# Patient Record
Sex: Male | Born: 1960 | Hispanic: Yes | Marital: Married | Smoking: Former smoker
Health system: Southern US, Community
[De-identification: ages and names within clinical notes are randomized; demographics above are authoritative.]

## PROBLEM LIST (undated history)

## (undated) DIAGNOSIS — I10 Essential (primary) hypertension: Secondary | ICD-10-CM

## (undated) DIAGNOSIS — E785 Hyperlipidemia, unspecified: Secondary | ICD-10-CM

## (undated) HISTORY — DX: Essential (primary) hypertension: I10

---

## 1983-10-05 HISTORY — PX: FRACTURE SURGERY: SHX138

## 2011-11-11 ENCOUNTER — Ambulatory Visit: Payer: Self-pay | Admitting: Pain Medicine

## 2011-11-25 ENCOUNTER — Ambulatory Visit: Payer: Self-pay | Admitting: Pain Medicine

## 2011-12-06 ENCOUNTER — Ambulatory Visit: Payer: Self-pay | Admitting: Pain Medicine

## 2011-12-16 ENCOUNTER — Ambulatory Visit: Payer: Self-pay | Admitting: Pain Medicine

## 2012-01-12 ENCOUNTER — Ambulatory Visit: Payer: Self-pay | Admitting: Pain Medicine

## 2012-01-20 ENCOUNTER — Ambulatory Visit: Payer: Self-pay | Admitting: Pain Medicine

## 2012-02-02 ENCOUNTER — Ambulatory Visit: Payer: Self-pay | Admitting: Pain Medicine

## 2015-07-15 ENCOUNTER — Ambulatory Visit: Payer: Self-pay

## 2015-09-04 ENCOUNTER — Ambulatory Visit: Payer: Self-pay

## 2015-09-04 LAB — BASIC METABOLIC PANEL
BUN: 13 mg/dL (ref 4–21)
Creatinine: 0.7 mg/dL (ref <=1.3)
Glucose: 105 mg/dL
POTASSIUM: 5 mmol/L (ref 3.4–5.3)
Sodium: 141 mmol/L (ref 137–147)

## 2015-09-04 LAB — HEPATIC FUNCTION PANEL
ALT: 32 U/L (ref 10–40)
AST: 22 U/L (ref 14–40)
Alkaline Phosphatase: 64 U/L (ref 25–125)
Bilirubin, Total: 0.3 mg/dL

## 2015-09-04 LAB — LIPID PANEL
CHOLESTEROL: 191 mg/dL (ref 0–200)
HDL: 30 mg/dL — AB (ref 35–70)
LDL CALC: 129 mg/dL
Triglycerides: 160 mg/dL (ref 40–160)

## 2015-09-04 LAB — HEMOGLOBIN A1C: Hemoglobin A1C: 5.7

## 2015-09-04 LAB — PSA: PSA: 1.3

## 2015-09-04 LAB — TSH: TSH: 3.27 u[IU]/mL (ref <=5.90)

## 2015-09-16 ENCOUNTER — Other Ambulatory Visit: Payer: Self-pay

## 2015-09-16 LAB — CBC AND DIFFERENTIAL
HEMATOCRIT: 40 % — AB (ref 41–53)
HEMOGLOBIN: 13.9 g/dL (ref 13.5–17.5)
NEUTROS ABS: 3 /uL
PLATELETS: 197 10*3/uL (ref 150–399)
WBC: 5.6 10*3/mL

## 2015-09-18 ENCOUNTER — Ambulatory Visit: Payer: Self-pay

## 2015-09-18 ENCOUNTER — Ambulatory Visit: Payer: Self-pay | Admitting: Ophthalmology

## 2015-09-25 ENCOUNTER — Ambulatory Visit: Payer: Self-pay | Admitting: Ophthalmology

## 2015-10-07 ENCOUNTER — Ambulatory Visit: Payer: Self-pay

## 2015-10-30 ENCOUNTER — Ambulatory Visit: Payer: Self-pay

## 2015-10-30 DIAGNOSIS — R7303 Prediabetes: Secondary | ICD-10-CM | POA: Insufficient documentation

## 2015-10-30 DIAGNOSIS — I10 Essential (primary) hypertension: Secondary | ICD-10-CM | POA: Insufficient documentation

## 2015-10-30 DIAGNOSIS — N400 Enlarged prostate without lower urinary tract symptoms: Secondary | ICD-10-CM | POA: Insufficient documentation

## 2015-12-23 DIAGNOSIS — R7303 Prediabetes: Secondary | ICD-10-CM

## 2015-12-23 DIAGNOSIS — N400 Enlarged prostate without lower urinary tract symptoms: Secondary | ICD-10-CM

## 2015-12-23 DIAGNOSIS — I10 Essential (primary) hypertension: Secondary | ICD-10-CM

## 2016-01-05 ENCOUNTER — Ambulatory Visit: Payer: Self-pay

## 2016-01-08 ENCOUNTER — Other Ambulatory Visit: Payer: Self-pay

## 2016-01-08 DIAGNOSIS — E785 Hyperlipidemia, unspecified: Secondary | ICD-10-CM

## 2016-01-08 DIAGNOSIS — I10 Essential (primary) hypertension: Secondary | ICD-10-CM

## 2016-01-09 LAB — COMPREHENSIVE METABOLIC PANEL
ALBUMIN: 4.5 g/dL (ref 3.5–5.5)
ALK PHOS: 59 IU/L (ref 39–117)
ALT: 27 IU/L (ref 0–44)
AST: 22 IU/L (ref 0–40)
Albumin/Globulin Ratio: 1.9 (ref 1.2–2.2)
BILIRUBIN TOTAL: 0.5 mg/dL (ref 0.0–1.2)
BUN / CREAT RATIO: 11 (ref 9–20)
BUN: 9 mg/dL (ref 6–24)
CO2: 26 mmol/L (ref 18–29)
CREATININE: 0.81 mg/dL (ref 0.76–1.27)
Calcium: 9.2 mg/dL (ref 8.7–10.2)
Chloride: 99 mmol/L (ref 96–106)
GFR, EST AFRICAN AMERICAN: 117 mL/min/{1.73_m2} (ref >59)
GFR, EST NON AFRICAN AMERICAN: 101 mL/min/{1.73_m2} (ref >59)
GLUCOSE: 96 mg/dL (ref 65–99)
Globulin, Total: 2.4 g/dL (ref 1.5–4.5)
Potassium: 5 mmol/L (ref 3.5–5.2)
SODIUM: 139 mmol/L (ref 134–144)
Total Protein: 6.9 g/dL (ref 6.0–8.5)

## 2016-01-09 LAB — LIPID PANEL
CHOL/HDL RATIO: 4.8 ratio (ref 0.0–5.0)
Cholesterol, Total: 179 mg/dL (ref 100–199)
HDL: 37 mg/dL — ABNORMAL LOW (ref >39)
LDL Calculated: 124 mg/dL — ABNORMAL HIGH (ref 0–99)
Triglycerides: 91 mg/dL (ref 0–149)
VLDL CHOLESTEROL CAL: 18 mg/dL (ref 5–40)

## 2016-01-15 ENCOUNTER — Ambulatory Visit: Payer: Self-pay

## 2016-01-15 NOTE — Progress Notes (Unsigned)
Subjective:     Patient ID: Scott Bishop, male   DOB: 07/05/1961, 55 y.o.   MRN: [id number]  HPI Here to review labs. Had blood work for cholesterol/lipids.    Review of Systems     Objective:   Physical Exam     Assessment:     HTN Controlled. Continue with the lisinoprils  BPH States since starting that he has not been able to ejaculate and has had decreased libido. Is able to maintain an erection.   Urinary frequency was approximately 3-times/night. Had difficulty filling the Rapaflo medication at [facility]. Would like an alternative drug if it is easy to get. Would prefer to try to get the medication through [facility]. Consider 5-alpha-redutase   Prediabetes.  Recently lost weight. Continue with Lorrie's class    Physical exam  Cardo: nml s1/s2. No m/r/g. Carotid clear to ausc.  Pulm: CTAB     Plan:     HTN Continue with lisinopril as prescribed.   BPH Consider switching to the 5-alpha-reductase. Would like to receive from [facility].  Prediabetes Continue with Lorrie's class. Pull A1c in a few months to see if prediabetic.   Lipid Panel  Elevated LDL (124). Down from 4 months ago. Recent weight loss -- 22 lbs over month (purposeful). Patient is in Lorrie's class and would like to continue weight loss. At this time I would advise conservative measures for reduction in LDL. I would advise pulling another panel in 3 months to see if improved.

## 2016-01-22 ENCOUNTER — Other Ambulatory Visit: Payer: Self-pay

## 2016-01-28 ENCOUNTER — Ambulatory Visit: Payer: Self-pay

## 2016-01-29 ENCOUNTER — Ambulatory Visit: Payer: Self-pay

## 2016-04-13 ENCOUNTER — Ambulatory Visit: Payer: Self-pay | Admitting: Nurse Practitioner

## 2016-04-13 VITALS — BP 114/66 | HR 61 | Temp 98.5°F | Wt 238.0 lb

## 2016-04-13 DIAGNOSIS — A6000 Herpesviral infection of urogenital system, unspecified: Secondary | ICD-10-CM

## 2016-04-13 DIAGNOSIS — I10 Essential (primary) hypertension: Secondary | ICD-10-CM

## 2016-04-13 MED ORDER — ACYCLOVIR 400 MG PO TABS: 400.0000 mg | ORAL_TABLET | Freq: Two times a day (BID) | ORAL | Status: DC

## 2016-04-13 MED ORDER — TAMSULOSIN HCL 0.4 MG PO CAPS: 0.4000 mg | ORAL_CAPSULE | Freq: Every day | ORAL | Status: DC

## 2016-04-13 MED ORDER — LISINOPRIL 20 MG PO TABS: 20.0000 mg | ORAL_TABLET | Freq: Every day | ORAL | Status: DC

## 2016-04-13 NOTE — Progress Notes (Signed)
Has completed treatment for shingles,   He is concerned that there is a connection between zoster and genital herpes.  Wife recently diagnosed with genital herpes,   Pt acknowledges taht they were apart for 2.5 years and he had unprotected sex.  Requesting testing for herpes simplex    Pt wishes to stay on suppression therapy to prevent inadverent transmission to his wife.    His wife took med for her acute outbreak.    On exam herpes zoster has cleared  Plan:    Will continue his antihypertensive meds,   Will continue his flomax for his prostatic hypertrophy  Will have him take acyclovir 400 mg bid to protect his wife as he was like the source of her infection.    Discussed the pathophysiology of and different types of herpes viruses.    Will drtaw HSV 1&2 antibiodies tonite and report when availab;e.  Pt to continue his excellent weight loss regimen understannding verbalized.

## 2016-04-15 LAB — COMPREHENSIVE METABOLIC PANEL
ALK PHOS: 58 IU/L (ref 39–117)
ALT: 17 IU/L (ref 0–44)
AST: 19 IU/L (ref 0–40)
Albumin/Globulin Ratio: 2.3 — ABNORMAL HIGH (ref 1.2–2.2)
Albumin: 4.5 g/dL (ref 3.5–5.5)
BUN/Creatinine Ratio: 24 — ABNORMAL HIGH (ref 9–20)
BUN: 16 mg/dL (ref 6–24)
Bilirubin Total: 0.3 mg/dL (ref 0.0–1.2)
CALCIUM: 9.1 mg/dL (ref 8.7–10.2)
CO2: 24 mmol/L (ref 18–29)
CREATININE: 0.66 mg/dL — AB (ref 0.76–1.27)
Chloride: 104 mmol/L (ref 96–106)
GFR calc Af Amer: 127 mL/min/{1.73_m2} (ref >59)
GFR, EST NON AFRICAN AMERICAN: 110 mL/min/{1.73_m2} (ref >59)
Globulin, Total: 2 g/dL (ref 1.5–4.5)
Glucose: 109 mg/dL — ABNORMAL HIGH (ref 65–99)
POTASSIUM: 4 mmol/L (ref 3.5–5.2)
SODIUM: 145 mmol/L — AB (ref 134–144)
Total Protein: 6.5 g/dL (ref 6.0–8.5)

## 2016-04-15 LAB — HSV 1 AND 2 IGM ABS, INDIRECT
HSV 1 IgM: <1:10 {titer}
HSV 2 IgM: <1:10 {titer}

## 2016-05-05 ENCOUNTER — Telehealth: Payer: Self-pay

## 2016-05-05 MED ORDER — ACYCLOVIR 400 MG PO TABS: 400.0000 mg | ORAL_TABLET | Freq: Every day | ORAL | 12 refills | Status: DC

## 2016-05-05 MED ORDER — ACYCLOVIR 400 MG PO TABS: 400.0000 mg | ORAL_TABLET | Freq: Two times a day (BID) | ORAL | 11 refills | Status: DC

## 2016-05-10 NOTE — Telephone Encounter (Signed)
Refill

## 2016-05-13 ENCOUNTER — Other Ambulatory Visit: Payer: Self-pay

## 2016-05-13 DIAGNOSIS — B029 Zoster without complications: Secondary | ICD-10-CM

## 2016-05-13 MED ORDER — ACYCLOVIR 400 MG PO TABS: 400.0000 mg | ORAL_TABLET | Freq: Two times a day (BID) | ORAL | 11 refills | Status: DC

## 2016-07-13 ENCOUNTER — Ambulatory Visit: Payer: Self-pay | Admitting: Adult Health Nurse Practitioner

## 2016-07-13 VITALS — BP 105/51 | HR 60 | Temp 98.3°F | Ht 71.0 in | Wt 239.0 lb

## 2016-07-13 DIAGNOSIS — N400 Enlarged prostate without lower urinary tract symptoms: Secondary | ICD-10-CM

## 2016-07-13 DIAGNOSIS — R7303 Prediabetes: Secondary | ICD-10-CM

## 2016-07-13 DIAGNOSIS — I10 Essential (primary) hypertension: Secondary | ICD-10-CM

## 2016-07-13 LAB — GLUCOSE, POCT (MANUAL RESULT ENTRY): POC Glucose: 92 mg/dl (ref 70–99)

## 2016-07-13 MED ORDER — TAMSULOSIN HCL 0.4 MG PO CAPS: 0.4000 mg | ORAL_CAPSULE | Freq: Every day | ORAL | 3 refills | Status: DC

## 2016-07-13 MED ORDER — LISINOPRIL 10 MG PO TABS: 10.0000 mg | ORAL_TABLET | Freq: Every day | ORAL | 3 refills | Status: DC

## 2016-07-13 NOTE — Patient Instructions (Signed)
Labs in January and FU in 1 month for BP check.

## 2016-07-13 NOTE — Progress Notes (Signed)
  Patient: Scott Bishop    DOB: 01/22/1961   55 y.o.   MRN: [id number] Visit Date: 07/13/2016  Today's Provider: [facility]   Chief Complaint  Patient presents with  . Follow-up   Subjective:    HPI   HTN:  Tolerating medications well.  Not taking BP at home.  Denies low sodium diet.  Reports he is exercising at least 5 times week.      No Known Allergies Previous Medications   ACYCLOVIR (ZOVIRAX) 400 MG TABLET    Take 1 tablet (400 mg total) by mouth 2 (two) times daily.   LISINOPRIL (PRINIVIL,ZESTRIL) 20 MG TABLET    Take 1 tablet (20 mg total) by mouth daily.   TAMSULOSIN (FLOMAX) 0.4 MG CAPS CAPSULE    Take 1 capsule (0.4 mg total) by mouth daily.    Review of Systems  All other systems reviewed and are negative.   Social History  Substance Use Topics  . Smoking status: Former Smoker    Quit date: 10/04/1997  . Smokeless tobacco: Not on file  . Alcohol use No     Comment: Quit in 1999.    Objective:   BP (!) 105/51 (BP Location: Left Arm, Patient Position: Sitting)   Pulse 60   Temp 98.3 F (36.8 C)   Ht 5\' 11"  (1.803 m)   Wt 239 lb (108.4 kg)   BMI 33.33 kg/m   Physical Exam  Constitutional: He is oriented to person, place, and time. He appears well-developed and well-nourished.  HENT:  Head: Normocephalic and atraumatic.  Neck: Normal range of motion. Neck supple.  Cardiovascular: Normal rate, regular rhythm and normal heart sounds.   Pulmonary/Chest: Effort normal and breath sounds normal.  Abdominal: Soft. Bowel sounds are normal.  Musculoskeletal: Normal range of motion.  Neurological: He is alert and oriented to person, place, and time.  Skin: Skin is warm and dry.  Psychiatric: He has a normal mood and affect.        Assessment & Plan:         HTN:  Well controlled.  At goal.  Continue exercise, low salt diet encouraged.  Decreasing Lisinopril to 10mg .  FU in 1 month for BP check.   CBG today 92-   HSV:  Titer  negative.  D/C  Acyclovir.   BPH:  Will continue tamulosin.  Monitor for symptoms.   [facility]   [facility]

## 2016-07-15 ENCOUNTER — Other Ambulatory Visit: Payer: Self-pay

## 2016-07-15 MED ORDER — LISINOPRIL 10 MG PO TABS: 10.0000 mg | ORAL_TABLET | Freq: Every day | ORAL | 3 refills | Status: DC

## 2016-07-15 MED ORDER — TAMSULOSIN HCL 0.4 MG PO CAPS: 0.4000 mg | ORAL_CAPSULE | Freq: Every day | ORAL | 3 refills | Status: DC

## 2016-07-15 NOTE — Telephone Encounter (Signed)
Patient stopped by to say that [facility] did not receive prescriptions for lisinopril and Flomax written for on 07/13/16.  Wanted to see if we could reorder them to resend to pharmacy.  He was instructed to check with pharmacy Friday morning (07/16/16) to see if they were received.

## 2016-08-10 ENCOUNTER — Other Ambulatory Visit: Payer: Self-pay

## 2016-08-10 ENCOUNTER — Ambulatory Visit: Payer: Self-pay

## 2016-08-10 VITALS — BP 104/61

## 2016-08-10 DIAGNOSIS — I10 Essential (primary) hypertension: Secondary | ICD-10-CM

## 2016-08-10 MED ORDER — LISINOPRIL 10 MG PO TABS: 10.0000 mg | ORAL_TABLET | Freq: Every day | ORAL | 3 refills | Status: DC

## 2016-09-07 ENCOUNTER — Other Ambulatory Visit: Payer: Self-pay | Admitting: Adult Health Nurse Practitioner

## 2016-09-07 ENCOUNTER — Other Ambulatory Visit: Payer: Self-pay

## 2016-10-12 ENCOUNTER — Other Ambulatory Visit: Payer: Self-pay

## 2016-10-12 DIAGNOSIS — I10 Essential (primary) hypertension: Secondary | ICD-10-CM

## 2016-10-13 LAB — COMPREHENSIVE METABOLIC PANEL
ALT: 20 IU/L (ref 0–44)
AST: 17 IU/L (ref 0–40)
Albumin/Globulin Ratio: 2.2 (ref 1.2–2.2)
Albumin: 4.4 g/dL (ref 3.5–5.5)
Alkaline Phosphatase: 55 IU/L (ref 39–117)
BILIRUBIN TOTAL: 0.2 mg/dL (ref 0.0–1.2)
BUN/Creatinine Ratio: 23 — ABNORMAL HIGH (ref 9–20)
BUN: 16 mg/dL (ref 6–24)
CALCIUM: 9 mg/dL (ref 8.7–10.2)
CHLORIDE: 103 mmol/L (ref 96–106)
CO2: 28 mmol/L (ref 18–29)
Creatinine, Ser: 0.71 mg/dL — ABNORMAL LOW (ref 0.76–1.27)
GFR calc non Af Amer: 106 mL/min/{1.73_m2} (ref >59)
GFR, EST AFRICAN AMERICAN: 123 mL/min/{1.73_m2} (ref >59)
Globulin, Total: 2 g/dL (ref 1.5–4.5)
Glucose: 103 mg/dL — ABNORMAL HIGH (ref 65–99)
Potassium: 4.1 mmol/L (ref 3.5–5.2)
Sodium: 144 mmol/L (ref 134–144)
TOTAL PROTEIN: 6.4 g/dL (ref 6.0–8.5)

## 2016-10-13 LAB — LIPID PANEL
CHOLESTEROL TOTAL: 180 mg/dL (ref 100–199)
Chol/HDL Ratio: 4.2 ratio units (ref 0.0–5.0)
HDL: 43 mg/dL (ref >39)
LDL Calculated: 118 mg/dL — ABNORMAL HIGH (ref 0–99)
Triglycerides: 96 mg/dL (ref 0–149)
VLDL CHOLESTEROL CAL: 19 mg/dL (ref 5–40)

## 2016-10-21 ENCOUNTER — Ambulatory Visit: Payer: Self-pay

## 2016-10-26 ENCOUNTER — Ambulatory Visit: Payer: Self-pay | Admitting: Adult Health Nurse Practitioner

## 2016-10-26 VITALS — BP 127/71 | HR 61 | Temp 99.3°F | Wt 266.4 lb

## 2016-10-26 DIAGNOSIS — I10 Essential (primary) hypertension: Secondary | ICD-10-CM

## 2016-10-26 DIAGNOSIS — R7303 Prediabetes: Secondary | ICD-10-CM

## 2016-10-26 DIAGNOSIS — E785 Hyperlipidemia, unspecified: Secondary | ICD-10-CM | POA: Insufficient documentation

## 2016-10-26 LAB — GLUCOSE, POCT (MANUAL RESULT ENTRY): POC Glucose: 90 mg/dl (ref 70–99)

## 2016-10-26 MED ORDER — ATORVASTATIN CALCIUM 10 MG PO TABS: 10.0000 mg | ORAL_TABLET | Freq: Every day | ORAL | 2 refills | Status: DC

## 2016-10-26 MED ORDER — LISINOPRIL 10 MG PO TABS: 10.0000 mg | ORAL_TABLET | Freq: Every day | ORAL | 1 refills | Status: DC

## 2016-10-26 NOTE — Progress Notes (Signed)
  Patient: Scott CoinsJesus Housley Male    DOB: 08/13/1961   56 y.o.   MRN: [id number] Visit Date: 10/26/2016  Today's Provider: Jacelyn Pieah Doles-Johnson, NP   Chief Complaint  Patient presents with  . Follow-up  . Nasal Congestion   Subjective:    HPI  Taking Lisinopril 10mg  daily.  Denies dizziness, HA, CP.  Pt states that he has reduced his stress level.    Pt with cold symptoms with slight fever.  Pt states that the symptoms started Saturday.  Denies chills, nausea, vomiting, diarrhea.  LDL-118.      No Known Allergies Previous Medications   ACYCLOVIR (ZOVIRAX) 400 MG TABLET    Take 1 tablet (400 mg total) by mouth 2 (two) times daily.   LISINOPRIL (PRINIVIL,ZESTRIL) 10 MG TABLET    Take 1 tablet (10 mg total) by mouth daily.   TAMSULOSIN (FLOMAX) 0.4 MG CAPS CAPSULE    Take 1 capsule (0.4 mg total) by mouth daily.    Review of Systems  All other systems reviewed and are negative.   Social History  Substance Use Topics  . Smoking status: Former Smoker    Quit date: 10/04/1997  . Smokeless tobacco: Not on file  . Alcohol use No     Comment: Quit in 1999.    Objective:   BP 127/71   Pulse 61   Temp 99.3 F (37.4 C)   Wt 266 lb 6.4 oz (120.8 kg)   BMI 37.16 kg/m   Physical Exam  Constitutional: He is oriented to person, place, and time. He appears well-developed and well-nourished.  HENT:  Head: Normocephalic and atraumatic.  Cardiovascular: Normal rate, regular rhythm and normal heart sounds.   Pulmonary/Chest: Effort normal and breath sounds normal.  Abdominal: Soft. Bowel sounds are normal.  Neurological: He is alert and oriented to person, place, and time.  Skin: Skin is warm.  Vitals reviewed.       Assessment & Plan:          OTC cold medicine.  Increase fluids.  FU precautions given.   HTN:  Controlled.  Goal BP <130/90.   Continue current medication regimen.  Encourage low salt diet and exercise.   HLD:  Start low dose lipitor 10mg .  Low  cholesterol diet and exercise.   FU in 8 weeks for CMP check.  Pt will in [place] in mid March.    Jacelyn Pieah Doles-Johnson, NP   [facility]

## 2016-10-27 ENCOUNTER — Ambulatory Visit: Payer: Self-pay

## 2016-10-27 VITALS — Wt 248.9 lb

## 2016-10-27 DIAGNOSIS — R7303 Prediabetes: Secondary | ICD-10-CM

## 2016-11-12 NOTE — Progress Notes (Unsigned)
Patient came to the MDPP Class at [facility]. 

## 2016-11-15 NOTE — Patient Instructions (Signed)
Patient to work on eating better as discussed in the lesson plan.

## 2016-12-01 ENCOUNTER — Ambulatory Visit: Payer: Self-pay

## 2016-12-28 ENCOUNTER — Ambulatory Visit: Payer: Self-pay | Admitting: Adult Health Nurse Practitioner

## 2016-12-28 VITALS — BP 127/43 | HR 62 | Temp 98.2°F | Wt 266.8 lb

## 2016-12-28 DIAGNOSIS — I10 Essential (primary) hypertension: Secondary | ICD-10-CM

## 2016-12-28 DIAGNOSIS — E785 Hyperlipidemia, unspecified: Secondary | ICD-10-CM

## 2016-12-28 MED ORDER — LISINOPRIL 10 MG PO TABS: 10.0000 mg | ORAL_TABLET | Freq: Every day | ORAL | 1 refills | Status: DC

## 2016-12-28 MED ORDER — ATORVASTATIN CALCIUM 10 MG PO TABS: 10.0000 mg | ORAL_TABLET | Freq: Every day | ORAL | 2 refills | Status: DC

## 2016-12-28 MED ORDER — TAMSULOSIN HCL 0.4 MG PO CAPS: 0.4000 mg | ORAL_CAPSULE | Freq: Every day | ORAL | 3 refills | Status: DC

## 2016-12-28 NOTE — Progress Notes (Signed)
  Patient: Scott Bishop    DOB: 04/15/1961   56 y.o.   MRN: [id number] Visit Date: 12/28/2016  Today's Provider: Jacelyn Pieah Doles-Johnson, NP   Chief Complaint  Patient presents with  . Follow-up   Subjective:    HPI   HLD:  Pt states that he is taking Lipitor intermittently. Lipitor started on last visit.  Pt states that he just came back from [place] and has not been monitoring his diet.       No Known Allergies Previous Medications   ACYCLOVIR (ZOVIRAX) 400 MG TABLET    Take 1 tablet (400 mg total) by mouth 2 (two) times daily.   ATORVASTATIN (LIPITOR) 10 MG TABLET    Take 1 tablet (10 mg total) by mouth daily.   LISINOPRIL (PRINIVIL,ZESTRIL) 10 MG TABLET    Take 1 tablet (10 mg total) by mouth daily.   TAMSULOSIN (FLOMAX) 0.4 MG CAPS CAPSULE    Take 1 capsule (0.4 mg total) by mouth daily.    Review of Systems  All other systems reviewed and are negative.   Social History  Substance Use Topics  . Smoking status: Former Smoker    Quit date: 10/04/1997  . Smokeless tobacco: Never Used  . Alcohol use No     Comment: Quit in 1999.    Objective:   BP (!) 127/43 (BP Location: Left Arm, Patient Position: Sitting, Cuff Size: Normal)   Pulse 62   Temp 98.2 F (36.8 C)   Wt 266 lb 12.8 oz (121 kg)   BMI 37.21 kg/m   Physical Exam  Constitutional: He is oriented to person, place, and time. He appears well-developed and well-nourished.  HENT:  Head: Normocephalic and atraumatic.  Cardiovascular: Normal rate, regular rhythm and normal heart sounds.   Pulmonary/Chest: Effort normal and breath sounds normal.  Neurological: He is alert and oriented to person, place, and time.  Skin: Skin is warm and dry.  Vitals reviewed.       Assessment & Plan:     HTN:  Controlled.   Goal BP <140/80  Continue current medication regimen.  Encourage low salt diet and exercise.   HLD:  Goal LDL <100.  Continue current regimen.  Encourage low cholesterol, low fat diet and  exercise.  Check LFTs today.         Jacelyn Pieah Doles-Johnson, NP   [facility] [place]

## 2016-12-29 LAB — COMPREHENSIVE METABOLIC PANEL
A/G RATIO: 2.2 (ref 1.2–2.2)
ALK PHOS: 61 IU/L (ref 39–117)
ALT: 29 IU/L (ref 0–44)
AST: 26 IU/L (ref 0–40)
Albumin: 4.6 g/dL (ref 3.5–5.5)
BILIRUBIN TOTAL: 0.3 mg/dL (ref 0.0–1.2)
BUN/Creatinine Ratio: 16 (ref 9–20)
BUN: 12 mg/dL (ref 6–24)
CHLORIDE: 102 mmol/L (ref 96–106)
CO2: 27 mmol/L (ref 18–29)
Calcium: 9.1 mg/dL (ref 8.7–10.2)
Creatinine, Ser: 0.74 mg/dL — ABNORMAL LOW (ref 0.76–1.27)
GFR calc non Af Amer: 105 mL/min/{1.73_m2} (ref >59)
GFR, EST AFRICAN AMERICAN: 121 mL/min/{1.73_m2} (ref >59)
Globulin, Total: 2.1 g/dL (ref 1.5–4.5)
Glucose: 96 mg/dL (ref 65–99)
POTASSIUM: 4.5 mmol/L (ref 3.5–5.2)
Sodium: 142 mmol/L (ref 134–144)
TOTAL PROTEIN: 6.7 g/dL (ref 6.0–8.5)

## 2017-02-08 ENCOUNTER — Telehealth: Payer: Self-pay

## 2017-02-08 DIAGNOSIS — I10 Essential (primary) hypertension: Secondary | ICD-10-CM

## 2017-02-08 MED ORDER — TAMSULOSIN HCL 0.4 MG PO CAPS: 0.4000 mg | ORAL_CAPSULE | Freq: Every day | ORAL | 3 refills | Status: DC

## 2017-04-26 ENCOUNTER — Ambulatory Visit: Payer: Self-pay

## 2017-05-30 ENCOUNTER — Other Ambulatory Visit: Payer: Self-pay

## 2017-05-30 DIAGNOSIS — I10 Essential (primary) hypertension: Secondary | ICD-10-CM

## 2017-05-30 MED ORDER — LISINOPRIL 10 MG PO TABS: 10.0000 mg | ORAL_TABLET | Freq: Every day | ORAL | 1 refills | Status: DC

## 2017-05-31 NOTE — Telephone Encounter (Signed)
Entered in error

## 2017-06-02 ENCOUNTER — Ambulatory Visit: Payer: Self-pay | Admitting: Urology

## 2017-06-02 VITALS — BP 131/78 | HR 58 | Temp 98.8°F | Wt 275.9 lb

## 2017-06-02 DIAGNOSIS — E119 Type 2 diabetes mellitus without complications: Secondary | ICD-10-CM

## 2017-06-02 DIAGNOSIS — I10 Essential (primary) hypertension: Secondary | ICD-10-CM

## 2017-06-02 DIAGNOSIS — E7800 Pure hypercholesterolemia, unspecified: Secondary | ICD-10-CM

## 2017-06-02 DIAGNOSIS — R350 Frequency of micturition: Secondary | ICD-10-CM

## 2017-06-02 LAB — GLUCOSE, POCT (MANUAL RESULT ENTRY): POC Glucose: 111 mg/dl — AB (ref 70–99)

## 2017-06-02 MED ORDER — LISINOPRIL 10 MG PO TABS
10.0000 mg | ORAL_TABLET | Freq: Every day | ORAL | 1 refills | Status: DC
Start: 2017-06-02 — End: 2017-09-08

## 2017-06-02 MED ORDER — ATORVASTATIN CALCIUM 10 MG PO TABS: 10.0000 mg | ORAL_TABLET | Freq: Every day | ORAL | 2 refills | Status: DC

## 2017-06-02 MED ORDER — TAMSULOSIN HCL 0.4 MG PO CAPS: 0.4000 mg | ORAL_CAPSULE | Freq: Every day | ORAL | 3 refills | Status: DC

## 2017-06-02 NOTE — Progress Notes (Signed)
   Subjective:    Patient ID: Scott Bishop, male    DOB: 09/18/1961, 56 y.o.   MRN: [id number]  HPI   Pt is here for 6 mo f/u for med refill and R knee pain.  Pt has gained 9lbs since last visit in March.    Patient Active Problem List   Diagnosis Date Noted  . Hyperlipidemia 10/26/2016  . Essential hypertension 10/30/2015  . Benign prostatic hyperplasia 10/30/2015  . Prediabetes 10/30/2015   Allergies as of 06/02/2017   No Known Allergies     Medication List       Accurate as of 06/02/17  6:38 PM. Always use your most recent med list.          atorvastatin 10 MG tablet Commonly known as:  LIPITOR Take 1 tablet (10 mg total) by mouth daily.   lisinopril 10 MG tablet Commonly known as:  PRINIVIL,ZESTRIL Take 1 tablet (10 mg total) by mouth daily.   tamsulosin 0.4 MG Caps capsule Commonly known as:  FLOMAX Take 1 capsule (0.4 mg total) by mouth daily.            Discharge Care Instructions        Start     Ordered   06/02/17 0000  POCT Glucose (CBG)     06/02/17 1827       Review of Systems  Pt endorses he is not consistent with taking his Lipitor everyday b/c he doesn't want to take it.  Pt reports he is going to work harder on his diet and physical activity to lose weight. His goal is to weigh 250lb by next appt. Pt endorses he is not consistent with taking his Flomax everyday.      Objective:   Physical Exam  BP 131/78   Pulse (!) 58   Temp 98.8 F (37.1 C)   Wt 275 lb 14.4 oz (125.1 kg)   BMI 38.48 kg/m        Assessment & Plan:   F/u in 3 mo for weight management - goal is to be 250lb Labs before 3 mo f/u: Lipid, PSA Referral to Dr. Justice RocherFossier for assessment of R knee pain

## 2017-06-02 NOTE — Patient Instructions (Signed)
Follow up in 3mo.

## 2017-06-08 ENCOUNTER — Ambulatory Visit: Payer: Self-pay | Admitting: Specialist

## 2017-06-08 DIAGNOSIS — M25561 Pain in right knee: Secondary | ICD-10-CM

## 2017-06-08 NOTE — Progress Notes (Signed)
   Subjective:    Patient ID: Scott Bishop, male    DOB: 02/05/1961, 56 y.o.   MRN: [id number]  Normal gait. Heel/toe walking ok. Tandem gait ok. Able to do a partial squat. 0 eff. "Q" normal. 0 erythmia or effusion. Full AROM with P/F crepitus R>L In supine position ROM 0/140 degrees. 0 ML laxity 0 Lachman  Flex/Rot negative 1985 Fx L femur       Review of Systems  1985 Fx L femur     Objective:   Physical Exam   56 y/o with right knee pain since 2012. He was a runner and believes he injured his knee when he altered his running gait.  Pain is median; no history of swelling, giving out, or locking.  Now works at [employer] doing multiple jobs.  If he mows his lawn, he may have pain.  Today he is feeling good.         Assessment & Plan:  Normal gait. Heel/toe walking ok. Tandem gait ok. Able to do a partial squat. 0 eff. "Q" normal. 0 erythmia or effusion. Full AROM with P/F crepitus R>L In supine position ROM 0/140 degrees. 0 ML laxity 0 Lachman  Flex/Rot negative 1985 Fx L femur

## 2017-06-09 ENCOUNTER — Ambulatory Visit: Payer: Self-pay | Admitting: Ophthalmology

## 2017-06-23 ENCOUNTER — Ambulatory Visit: Payer: Self-pay | Admitting: Ophthalmology

## 2017-06-30 ENCOUNTER — Ambulatory Visit: Payer: Self-pay | Admitting: Ophthalmology

## 2017-09-01 ENCOUNTER — Other Ambulatory Visit: Payer: Self-pay

## 2017-09-01 DIAGNOSIS — E7800 Pure hypercholesterolemia, unspecified: Secondary | ICD-10-CM

## 2017-09-01 DIAGNOSIS — R350 Frequency of micturition: Secondary | ICD-10-CM

## 2017-09-02 LAB — LIPID PANEL
CHOLESTEROL TOTAL: 156 mg/dL (ref 100–199)
Chol/HDL Ratio: 3.6 ratio (ref 0.0–5.0)
HDL: 43 mg/dL (ref >39)
LDL CALC: 97 mg/dL (ref 0–99)
TRIGLYCERIDES: 79 mg/dL (ref 0–149)
VLDL CHOLESTEROL CAL: 16 mg/dL (ref 5–40)

## 2017-09-02 LAB — PSA: Prostate Specific Ag, Serum: 1 ng/mL (ref 0.0–4.0)

## 2017-09-08 ENCOUNTER — Ambulatory Visit: Payer: Self-pay | Admitting: Adult Health Nurse Practitioner

## 2017-09-08 VITALS — BP 121/73 | HR 58 | Temp 97.8°F | Wt 272.3 lb

## 2017-09-08 DIAGNOSIS — I10 Essential (primary) hypertension: Secondary | ICD-10-CM

## 2017-09-08 DIAGNOSIS — E785 Hyperlipidemia, unspecified: Secondary | ICD-10-CM

## 2017-09-08 DIAGNOSIS — R7303 Prediabetes: Secondary | ICD-10-CM

## 2017-09-08 MED ORDER — ATORVASTATIN CALCIUM 10 MG PO TABS
10.0000 mg | ORAL_TABLET | Freq: Every day | ORAL | 2 refills | Status: DC
Start: 2017-09-08 — End: 2017-11-15

## 2017-09-08 MED ORDER — TAMSULOSIN HCL 0.4 MG PO CAPS: 0.4000 mg | ORAL_CAPSULE | Freq: Every day | ORAL | 3 refills | Status: DC

## 2017-09-08 MED ORDER — LISINOPRIL 10 MG PO TABS: 10.0000 mg | ORAL_TABLET | Freq: Every day | ORAL | 1 refills | Status: DC

## 2017-09-08 NOTE — Progress Notes (Signed)
   Subjective:    Patient ID: Scott Bishop, male    DOB: 12/31/1960, 56 y.o.   MRN: [id number]  HPI   Scott Bishop is a 56 yo male here to review labs and for med refills.   Patient Active Problem List   Diagnosis Date Noted  . Hyperlipidemia 10/26/2016  . Essential hypertension 10/30/2015  . Benign prostatic hyperplasia 10/30/2015  . Prediabetes 10/30/2015   Allergies as of 09/08/2017   No Known Allergies     Medication List        Accurate as of 09/08/17  6:12 PM. Always use your most recent med list.          atorvastatin 10 MG tablet Commonly known as:  LIPITOR Take 1 tablet (10 mg total) by mouth daily.   lisinopril 10 MG tablet Commonly known as:  PRINIVIL,ZESTRIL Take 1 tablet (10 mg total) by mouth daily.   tamsulosin 0.4 MG Caps capsule Commonly known as:  FLOMAX Take 1 capsule (0.4 mg total) by mouth daily.        Review of Systems Pt reports he is taking all meds as Rx. Pt says his knee is better. He is walking 4-5 miles/day.  Labs reviewed.  Pt reports redness on R foot, big toe that is not going away after using OTC fungal treat. He reports no drainage or irritation.     Objective:   Physical Exam  Constitutional: He is oriented to person, place, and time. He appears well-developed and well-nourished.  Neck: Normal range of motion. Neck supple.  Cardiovascular: Normal rate, regular rhythm and normal heart sounds.  Pulmonary/Chest: Effort normal and breath sounds normal.  Abdominal: Soft. Bowel sounds are normal.  Musculoskeletal:       Feet:  Neurological: He is alert and oriented to person, place, and time.  Skin: Skin is warm and dry.  Vitals reviewed.   BP 121/73 (BP Location: Left Arm, Patient Position: Sitting, Cuff Size: Large)   Pulse (!) 58   Temp 97.8 F (36.6 C) (Oral)   Wt 272 lb 4.8 oz (123.5 kg)   BMI 37.98 kg/m        Assessment & Plan:   Check A1c, CBC, CMET today.  Continue current medications.  Encourage  lifestyle modifications.  F/u in 4 mo

## 2017-09-09 LAB — COMPREHENSIVE METABOLIC PANEL
A/G RATIO: 2 (ref 1.2–2.2)
ALBUMIN: 4.9 g/dL (ref 3.5–5.5)
ALK PHOS: 67 IU/L (ref 39–117)
ALT: 23 IU/L (ref 0–44)
AST: 26 IU/L (ref 0–40)
BUN / CREAT RATIO: 14 (ref 9–20)
BUN: 10 mg/dL (ref 6–24)
Bilirubin Total: 0.4 mg/dL (ref 0.0–1.2)
CO2: 27 mmol/L (ref 20–29)
CREATININE: 0.74 mg/dL — AB (ref 0.76–1.27)
Calcium: 9.1 mg/dL (ref 8.7–10.2)
Chloride: 101 mmol/L (ref 96–106)
GFR calc Af Amer: 120 mL/min/{1.73_m2} (ref >59)
GFR calc non Af Amer: 104 mL/min/{1.73_m2} (ref >59)
GLOBULIN, TOTAL: 2.4 g/dL (ref 1.5–4.5)
Glucose: 94 mg/dL (ref 65–99)
POTASSIUM: 5.1 mmol/L (ref 3.5–5.2)
SODIUM: 141 mmol/L (ref 134–144)
Total Protein: 7.3 g/dL (ref 6.0–8.5)

## 2017-09-09 LAB — CBC
Hematocrit: 43.9 % (ref 37.5–51.0)
Hemoglobin: 14.6 g/dL (ref 13.0–17.7)
MCH: 30.2 pg (ref 26.6–33.0)
MCHC: 33.3 g/dL (ref 31.5–35.7)
MCV: 91 fL (ref 79–97)
PLATELETS: 180 10*3/uL (ref 150–379)
RBC: 4.84 x10E6/uL (ref 4.14–5.80)
RDW: 13.7 % (ref 12.3–15.4)
WBC: 6.7 10*3/uL (ref 3.4–10.8)

## 2017-09-09 LAB — HEMOGLOBIN A1C
Est. average glucose Bld gHb Est-mCnc: 120 mg/dL
HEMOGLOBIN A1C: 5.8 % — AB (ref 4.8–5.6)

## 2017-11-15 ENCOUNTER — Other Ambulatory Visit: Payer: Self-pay

## 2017-11-15 DIAGNOSIS — I10 Essential (primary) hypertension: Secondary | ICD-10-CM

## 2017-11-15 MED ORDER — LISINOPRIL 10 MG PO TABS: 10.0000 mg | ORAL_TABLET | Freq: Every day | ORAL | 1 refills | Status: DC

## 2017-11-15 MED ORDER — ATORVASTATIN CALCIUM 10 MG PO TABS: 10.0000 mg | ORAL_TABLET | Freq: Every day | ORAL | 2 refills | Status: DC

## 2017-11-15 MED ORDER — TAMSULOSIN HCL 0.4 MG PO CAPS: 0.4000 mg | ORAL_CAPSULE | Freq: Every day | ORAL | 3 refills | Status: DC

## 2017-11-15 NOTE — Progress Notes (Signed)
[facility].

## 2018-01-26 ENCOUNTER — Ambulatory Visit: Payer: Self-pay

## 2018-02-23 ENCOUNTER — Ambulatory Visit: Payer: Self-pay | Admitting: Adult Health Nurse Practitioner

## 2018-02-23 VITALS — BP 120/78 | HR 55 | Temp 98.1°F | Wt 284.6 lb

## 2018-02-23 DIAGNOSIS — E785 Hyperlipidemia, unspecified: Secondary | ICD-10-CM

## 2018-02-23 DIAGNOSIS — I10 Essential (primary) hypertension: Secondary | ICD-10-CM

## 2018-02-23 DIAGNOSIS — R7303 Prediabetes: Secondary | ICD-10-CM

## 2018-02-23 NOTE — Progress Notes (Signed)
  Patient: Scott Bishop Male    DOB: 1961-08-29   57 y.o.   MRN: [id number] Visit Date: 02/23/2018  Today's Provider: Jacelyn Pi, NP   Chief Complaint  Patient presents with  . Follow-up    8 month check up    Subjective:    HPI   Pt states that he has been trying to be more active- walking 5+ miles 3x weekly.  Pt states that he is not eating enough protein and eating more carbs.  He has gained 12 lbs since last visit.   He remains compliant with his medications.     No Known Allergies Previous Medications   ATORVASTATIN (LIPITOR) 10 MG TABLET    Take 1 tablet (10 mg total) by mouth daily.   LISINOPRIL (PRINIVIL,ZESTRIL) 10 MG TABLET    Take 1 tablet (10 mg total) by mouth daily.   TAMSULOSIN (FLOMAX) 0.4 MG CAPS CAPSULE    Take 1 capsule (0.4 mg total) by mouth daily.    Review of Systems  All other systems reviewed and are negative.   Social History   Tobacco Use  . Smoking status: Former Smoker    Last attempt to quit: 10/04/1997    Years since quitting: 20.4  . Smokeless tobacco: Never Used  Substance Use Topics  . Alcohol use: No    Comment: Quit in 1999.    Objective:   BP 120/78   Pulse (!) 55   Temp 98.1 F (36.7 C)   Wt 284 lb 9.6 oz (129.1 kg)   BMI 39.69 kg/m   Physical Exam  Constitutional: He is oriented to person, place, and time. He appears well-developed and well-nourished.  HENT:  Head: Normocephalic and atraumatic.  Cardiovascular: Normal rate, regular rhythm and normal heart sounds.  Pulmonary/Chest: Effort normal and breath sounds normal.  Abdominal: Soft. Bowel sounds are normal.  Neurological: He is alert and oriented to person, place, and time.  Skin: Skin is warm and dry.  Vitals reviewed.       Assessment & Plan:     Check A1c, lipid panel, CMET today.  Continue current medications.  Encourage lifestyle modifications and add protein back to diet.  F/u in 4 mo       Jacelyn Pi, NP   [facility]  [place]

## 2018-02-23 NOTE — Addendum Note (Signed)
Addended by: Charlene Brooke on: 02/23/2018 06:43 PM   Modules accepted: Orders

## 2018-02-24 LAB — COMPREHENSIVE METABOLIC PANEL
ALK PHOS: 63 IU/L (ref 39–117)
ALT: 23 IU/L (ref 0–44)
AST: 19 IU/L (ref 0–40)
Albumin/Globulin Ratio: 1.9 (ref 1.2–2.2)
Albumin: 4.3 g/dL (ref 3.5–5.5)
BUN/Creatinine Ratio: 18 (ref 9–20)
BUN: 13 mg/dL (ref 6–24)
Bilirubin Total: 0.5 mg/dL (ref 0.0–1.2)
CHLORIDE: 103 mmol/L (ref 96–106)
CO2: 25 mmol/L (ref 20–29)
CREATININE: 0.74 mg/dL — AB (ref 0.76–1.27)
Calcium: 8.6 mg/dL — ABNORMAL LOW (ref 8.7–10.2)
GFR calc Af Amer: 119 mL/min/{1.73_m2} (ref >59)
GFR calc non Af Amer: 103 mL/min/{1.73_m2} (ref >59)
GLOBULIN, TOTAL: 2.3 g/dL (ref 1.5–4.5)
GLUCOSE: 92 mg/dL (ref 65–99)
Potassium: 4.7 mmol/L (ref 3.5–5.2)
SODIUM: 143 mmol/L (ref 134–144)
Total Protein: 6.6 g/dL (ref 6.0–8.5)

## 2018-02-24 LAB — LIPID PANEL
CHOLESTEROL TOTAL: 131 mg/dL (ref 100–199)
Chol/HDL Ratio: 3.4 ratio (ref 0.0–5.0)
HDL: 39 mg/dL — ABNORMAL LOW (ref >39)
LDL CALC: 73 mg/dL (ref 0–99)
TRIGLYCERIDES: 94 mg/dL (ref 0–149)
VLDL Cholesterol Cal: 19 mg/dL (ref 5–40)

## 2018-02-24 LAB — HEMOGLOBIN A1C
Est. average glucose Bld gHb Est-mCnc: 117 mg/dL
Hgb A1c MFr Bld: 5.7 % — ABNORMAL HIGH (ref 4.8–5.6)

## 2018-03-02 ENCOUNTER — Other Ambulatory Visit: Payer: Self-pay

## 2018-03-02 DIAGNOSIS — I10 Essential (primary) hypertension: Secondary | ICD-10-CM

## 2018-03-02 MED ORDER — ATORVASTATIN CALCIUM 10 MG PO TABS: 10.0000 mg | ORAL_TABLET | Freq: Every day | ORAL | 2 refills | Status: DC

## 2018-03-02 MED ORDER — TAMSULOSIN HCL 0.4 MG PO CAPS: 0.4000 mg | ORAL_CAPSULE | Freq: Every day | ORAL | 3 refills | Status: DC

## 2018-03-02 MED ORDER — LISINOPRIL 10 MG PO TABS: 10.0000 mg | ORAL_TABLET | Freq: Every day | ORAL | 1 refills | Status: DC

## 2018-03-02 NOTE — Progress Notes (Signed)
Pt requested [facility]

## 2018-03-03 ENCOUNTER — Telehealth: Payer: Self-pay

## 2018-03-03 NOTE — Telephone Encounter (Signed)
-----   Message from Jacelyn Pi, NP sent at 03/02/2018  5:54 PM EDT ----- Labs are stable.

## 2018-03-03 NOTE — Telephone Encounter (Signed)
Spoke with pt and gave stable lab results.

## 2018-03-14 ENCOUNTER — Telehealth: Payer: Self-pay

## 2018-03-14 NOTE — Telephone Encounter (Signed)
[facility] called [place] and needs early refills on Tamsulosin, lisinopril, and atorvastatin. Refills ok'd.

## 2018-06-06 ENCOUNTER — Other Ambulatory Visit: Payer: Self-pay

## 2018-06-06 DIAGNOSIS — E1169 Type 2 diabetes mellitus with other specified complication: Secondary | ICD-10-CM

## 2018-06-06 DIAGNOSIS — E785 Hyperlipidemia, unspecified: Principal | ICD-10-CM

## 2018-06-06 MED ORDER — ATORVASTATIN CALCIUM 10 MG PO TABS: 10.0000 mg | ORAL_TABLET | Freq: Every day | ORAL | 2 refills | Status: DC

## 2018-06-29 ENCOUNTER — Ambulatory Visit: Payer: Self-pay | Admitting: Adult Health Nurse Practitioner

## 2018-06-29 VITALS — BP 137/74 | Temp 97.8°F | Ht 69.0 in | Wt 275.6 lb

## 2018-06-29 DIAGNOSIS — E1169 Type 2 diabetes mellitus with other specified complication: Secondary | ICD-10-CM | POA: Insufficient documentation

## 2018-06-29 DIAGNOSIS — I10 Essential (primary) hypertension: Secondary | ICD-10-CM

## 2018-06-29 DIAGNOSIS — E785 Hyperlipidemia, unspecified: Principal | ICD-10-CM

## 2018-06-29 MED ORDER — LISINOPRIL 10 MG PO TABS: 10.0000 mg | ORAL_TABLET | Freq: Every day | ORAL | 1 refills | Status: DC

## 2018-06-29 MED ORDER — ATORVASTATIN CALCIUM 10 MG PO TABS: 10.0000 mg | ORAL_TABLET | Freq: Every day | ORAL | 2 refills | Status: DC

## 2018-06-29 MED ORDER — TAMSULOSIN HCL 0.4 MG PO CAPS: 0.4000 mg | ORAL_CAPSULE | Freq: Every day | ORAL | 3 refills | Status: DC

## 2018-06-29 NOTE — Progress Notes (Signed)
  Patient: Scott Bishop Male    DOB: May 26, 1961   57 y.o.   MRN: [id number] Visit Date: 06/29/2018  Today's Provider: Jacelyn Pi, NP   Chief Complaint  Patient presents with  . Follow-up    history of diabetes   Subjective:    HPI   States that he is doing well. Taking medications as directed.    No Known Allergies Previous Medications   ATORVASTATIN (LIPITOR) 10 MG TABLET    Take 1 tablet (10 mg total) by mouth daily.   LISINOPRIL (PRINIVIL,ZESTRIL) 10 MG TABLET    Take 1 tablet (10 mg total) by mouth daily.   TAMSULOSIN (FLOMAX) 0.4 MG CAPS CAPSULE    Take 1 capsule (0.4 mg total) by mouth daily.    Review of Systems  All other systems reviewed and are negative.   Social History   Tobacco Use  . Smoking status: Former Smoker    Last attempt to quit: 10/04/1997    Years since quitting: 20.7  . Smokeless tobacco: Never Used  Substance Use Topics  . Alcohol use: No    Comment: Quit in 1999.    Objective:   BP 137/74 (BP Location: Left Arm, Patient Position: Sitting)   Temp 97.8 F (36.6 C)   Ht 5\' 9"  (1.753 m)   Wt 275 lb 9.6 oz (125 kg)   BMI 40.70 kg/m   Physical Exam  Constitutional: He is oriented to person, place, and time. He appears well-developed and well-nourished.  HENT:  Head: Normocephalic and atraumatic.  Neck: Normal range of motion. Neck supple.  Cardiovascular: Normal rate, regular rhythm and normal heart sounds.  Pulmonary/Chest: Effort normal and breath sounds normal.  Musculoskeletal: He exhibits no edema.  Neurological: He is alert and oriented to person, place, and time.  Skin: Skin is warm and dry.        Assessment & Plan:         HTN:  Borderline today.   Goal BP <130/80.  Continue current medication regimen.  Encourage low salt diet and exercise.  If still elevated at next visit will increase lisinopril.   DM:  A1c today.  Controlled without medications.  Encourage diabetic diet and exercise.    HLD:   Controlled.   Continue current regimen.  Encourage low cholesterol, low fat diet and exercise.   Routine labs today.         Jacelyn Pi, NP   [facility]

## 2018-06-29 NOTE — Addendum Note (Signed)
Addended by: Dustin Flock D on: 06/29/2018 07:24 PM   Modules accepted: Orders

## 2018-06-30 LAB — LIPID PANEL
CHOLESTEROL TOTAL: 135 mg/dL (ref 100–199)
Chol/HDL Ratio: 3.8 ratio (ref 0.0–5.0)
HDL: 36 mg/dL — AB (ref >39)
LDL CALC: 70 mg/dL (ref 0–99)
TRIGLYCERIDES: 144 mg/dL (ref 0–149)
VLDL CHOLESTEROL CAL: 29 mg/dL (ref 5–40)

## 2018-06-30 LAB — COMPREHENSIVE METABOLIC PANEL
A/G RATIO: 2.7 — AB (ref 1.2–2.2)
ALK PHOS: 68 IU/L (ref 39–117)
ALT: 23 IU/L (ref 0–44)
AST: 22 IU/L (ref 0–40)
Albumin: 4.8 g/dL (ref 3.5–5.5)
BILIRUBIN TOTAL: 0.5 mg/dL (ref 0.0–1.2)
BUN / CREAT RATIO: 26 — AB (ref 9–20)
BUN: 18 mg/dL (ref 6–24)
CHLORIDE: 103 mmol/L (ref 96–106)
CO2: 24 mmol/L (ref 20–29)
CREATININE: 0.68 mg/dL — AB (ref 0.76–1.27)
Calcium: 8.8 mg/dL (ref 8.7–10.2)
GFR calc Af Amer: 123 mL/min/{1.73_m2} (ref >59)
GFR calc non Af Amer: 107 mL/min/{1.73_m2} (ref >59)
GLOBULIN, TOTAL: 1.8 g/dL (ref 1.5–4.5)
Glucose: 110 mg/dL — ABNORMAL HIGH (ref 65–99)
POTASSIUM: 4.8 mmol/L (ref 3.5–5.2)
SODIUM: 143 mmol/L (ref 134–144)
Total Protein: 6.6 g/dL (ref 6.0–8.5)

## 2018-06-30 LAB — HEMOGLOBIN A1C
ESTIMATED AVERAGE GLUCOSE: 114 mg/dL
Hgb A1c MFr Bld: 5.6 % (ref 4.8–5.6)

## 2018-10-17 ENCOUNTER — Ambulatory Visit: Payer: Self-pay | Admitting: Adult Health Nurse Practitioner

## 2018-10-17 VITALS — BP 117/73 | HR 53 | Temp 97.8°F | Ht 71.0 in | Wt 275.0 lb

## 2018-10-17 DIAGNOSIS — E785 Hyperlipidemia, unspecified: Secondary | ICD-10-CM

## 2018-10-17 DIAGNOSIS — E1169 Type 2 diabetes mellitus with other specified complication: Secondary | ICD-10-CM

## 2018-10-17 DIAGNOSIS — I10 Essential (primary) hypertension: Secondary | ICD-10-CM

## 2018-10-17 DIAGNOSIS — Z0000 Encounter for general adult medical examination without abnormal findings: Secondary | ICD-10-CM

## 2018-10-17 DIAGNOSIS — R7303 Prediabetes: Secondary | ICD-10-CM

## 2018-10-17 MED ORDER — LISINOPRIL 10 MG PO TABS: 10.0000 mg | ORAL_TABLET | Freq: Every day | ORAL | 1 refills | Status: DC

## 2018-10-17 MED ORDER — ATORVASTATIN CALCIUM 10 MG PO TABS: 10.0000 mg | ORAL_TABLET | Freq: Every day | ORAL | 2 refills | Status: DC

## 2018-10-17 MED ORDER — TAMSULOSIN HCL 0.4 MG PO CAPS: 0.4000 mg | ORAL_CAPSULE | Freq: Every day | ORAL | 3 refills | Status: DC

## 2018-10-17 NOTE — Progress Notes (Signed)
  Patient: Scott Bishop Male    DOB: 1961/04/30   58 y.o.   MRN: [id number] Visit Date: 10/17/2018  Today's Provider: Jacelyn Pi, NP   Chief Complaint  Patient presents with  . Follow-up    No new health concerns   Subjective:    HPI  States that he is getting ready to start training with his son for the [facility] marathon in the fall- and plans to lose 40 lbs.   Taking all medications as directed.     No Known Allergies Previous Medications   ATORVASTATIN (LIPITOR) 10 MG TABLET    Take 1 tablet (10 mg total) by mouth daily.   LISINOPRIL (PRINIVIL,ZESTRIL) 10 MG TABLET    Take 1 tablet (10 mg total) by mouth daily.   TAMSULOSIN (FLOMAX) 0.4 MG CAPS CAPSULE    Take 1 capsule (0.4 mg total) by mouth daily.    Review of Systems  All other systems reviewed and are negative.   Social History   Tobacco Use  . Smoking status: Former Smoker    Last attempt to quit: 10/04/1997    Years since quitting: 21.0  . Smokeless tobacco: Never Used  Substance Use Topics  . Alcohol use: No    Comment: Quit in 1999.    Objective:   BP 117/73 (BP Location: Left Arm, Patient Position: Sitting, Cuff Size: Small)   Pulse (!) 53   Temp 97.8 F (36.6 C) (Oral)   Ht 5\' 11"  (1.803 m)   Wt 275 lb (124.7 kg)   BMI 38.35 kg/m   Physical Exam Vitals signs reviewed.  Constitutional:      Appearance: Normal appearance.  HENT:     Head: Normocephalic and atraumatic.  Neck:     Musculoskeletal: Neck supple.  Cardiovascular:     Rate and Rhythm: Normal rate and regular rhythm.  Pulmonary:     Effort: Pulmonary effort is normal.     Breath sounds: Normal breath sounds.  Abdominal:     General: Bowel sounds are normal.     Palpations: Abdomen is soft.  Skin:    General: Skin is warm and dry.  Neurological:     Mental Status: He is alert and oriented to person, place, and time.         Assessment & Plan:     HTN:    Controlled.  Goal BP <130/80.  Continue current medication  regimen.  Encourage low salt diet and exercise.   DM:   Controlled without medications.  Encourage diabetic diet and exercise.    HLD:  Controlled.   Continue current regimen.  Encourage low cholesterol, low fat diet and exercise.   Refill Flomax.   Labs a week prior to next OV. FU in 4 months.        Jacelyn Pi, NP   [facility]

## 2018-11-02 ENCOUNTER — Ambulatory Visit: Payer: Self-pay

## 2019-01-25 ENCOUNTER — Other Ambulatory Visit: Payer: Self-pay

## 2019-02-01 ENCOUNTER — Ambulatory Visit: Payer: Self-pay

## 2019-02-08 ENCOUNTER — Other Ambulatory Visit: Payer: Self-pay

## 2019-02-15 ENCOUNTER — Ambulatory Visit: Payer: Self-pay

## 2019-02-20 ENCOUNTER — Other Ambulatory Visit: Payer: Self-pay

## 2019-02-20 DIAGNOSIS — I10 Essential (primary) hypertension: Secondary | ICD-10-CM

## 2019-02-20 DIAGNOSIS — E1169 Type 2 diabetes mellitus with other specified complication: Secondary | ICD-10-CM

## 2019-02-20 DIAGNOSIS — E785 Hyperlipidemia, unspecified: Secondary | ICD-10-CM

## 2019-02-20 DIAGNOSIS — Z0000 Encounter for general adult medical examination without abnormal findings: Secondary | ICD-10-CM

## 2019-02-20 DIAGNOSIS — R7303 Prediabetes: Secondary | ICD-10-CM

## 2019-02-21 LAB — COMPREHENSIVE METABOLIC PANEL
ALT: 24 IU/L (ref 0–44)
AST: 19 IU/L (ref 0–40)
Albumin/Globulin Ratio: 2.2 (ref 1.2–2.2)
Albumin: 4.6 g/dL (ref 3.8–4.9)
Alkaline Phosphatase: 60 IU/L (ref 39–117)
BUN/Creatinine Ratio: 17 (ref 9–20)
BUN: 12 mg/dL (ref 6–24)
Bilirubin Total: 0.3 mg/dL (ref 0.0–1.2)
CO2: 23 mmol/L (ref 20–29)
Calcium: 9 mg/dL (ref 8.7–10.2)
Chloride: 103 mmol/L (ref 96–106)
Creatinine, Ser: 0.72 mg/dL — ABNORMAL LOW (ref 0.76–1.27)
GFR calc Af Amer: 120 mL/min/{1.73_m2} (ref >59)
GFR calc non Af Amer: 104 mL/min/{1.73_m2} (ref >59)
Globulin, Total: 2.1 g/dL (ref 1.5–4.5)
Glucose: 110 mg/dL — ABNORMAL HIGH (ref 65–99)
Potassium: 4.4 mmol/L (ref 3.5–5.2)
Sodium: 140 mmol/L (ref 134–144)
Total Protein: 6.7 g/dL (ref 6.0–8.5)

## 2019-02-21 LAB — CBC
Hematocrit: 41.1 % (ref 37.5–51.0)
Hemoglobin: 14.1 g/dL (ref 13.0–17.7)
MCH: 30.7 pg (ref 26.6–33.0)
MCHC: 34.3 g/dL (ref 31.5–35.7)
MCV: 90 fL (ref 79–97)
Platelets: 144 10*3/uL — ABNORMAL LOW (ref 150–450)
RBC: 4.59 x10E6/uL (ref 4.14–5.80)
RDW: 13.4 % (ref 11.6–15.4)
WBC: 5 10*3/uL (ref 3.4–10.8)

## 2019-02-21 LAB — LIPID PANEL
Chol/HDL Ratio: 3.9 ratio (ref 0.0–5.0)
Cholesterol, Total: 142 mg/dL (ref 100–199)
HDL: 36 mg/dL — ABNORMAL LOW (ref >39)
LDL Calculated: 82 mg/dL (ref 0–99)
Triglycerides: 119 mg/dL (ref 0–149)
VLDL Cholesterol Cal: 24 mg/dL (ref 5–40)

## 2019-02-21 LAB — TSH: TSH: 1.6 u[IU]/mL (ref 0.450–4.500)

## 2019-02-21 LAB — HEMOGLOBIN A1C
Est. average glucose Bld gHb Est-mCnc: 108 mg/dL
Hgb A1c MFr Bld: 5.4 % (ref 4.8–5.6)

## 2019-02-21 LAB — PSA: Prostate Specific Ag, Serum: 0.7 ng/mL (ref 0.0–4.0)

## 2019-02-22 ENCOUNTER — Other Ambulatory Visit: Payer: Self-pay

## 2019-02-22 ENCOUNTER — Ambulatory Visit: Payer: Self-pay | Admitting: Adult Health Nurse Practitioner

## 2019-02-22 DIAGNOSIS — I10 Essential (primary) hypertension: Secondary | ICD-10-CM

## 2019-02-22 DIAGNOSIS — E785 Hyperlipidemia, unspecified: Secondary | ICD-10-CM

## 2019-02-22 DIAGNOSIS — E1169 Type 2 diabetes mellitus with other specified complication: Secondary | ICD-10-CM

## 2019-02-22 MED ORDER — ATORVASTATIN CALCIUM 10 MG PO TABS: 10.0000 mg | ORAL_TABLET | Freq: Every day | ORAL | 2 refills | Status: DC

## 2019-02-22 MED ORDER — TAMSULOSIN HCL 0.4 MG PO CAPS: 0.4000 mg | ORAL_CAPSULE | Freq: Every day | ORAL | 3 refills | Status: DC

## 2019-02-22 MED ORDER — LISINOPRIL 10 MG PO TABS: 10.0000 mg | ORAL_TABLET | Freq: Every day | ORAL | 1 refills | Status: DC

## 2019-02-22 NOTE — Progress Notes (Signed)
  Patient: Scott Bishop Male    DOB: 1961-07-27   58 y.o.   MRN: [id number] Visit Date: 02/22/2019  Today's Provider: [facility]   No chief complaint on file.  Subjective:    HPI   Telephonic visit.   Taking all medications as directed.  Walking 3-5 miles daily - rode a bike x 3 days and thinks he developed a groin rash- intermittent itching, not open or moist. States that it is getting better.   Had exposure to [facility] positive persons within the last month (May 4)-they were both wearing masks- he was only in contact with the person for a few minutes or less- he  experienced SOB and chest tightness and did not at that time know the person he contacted was positive. SOB resolved 3 days ago continues to have chest tightness intermittently x a few seconds. BP was 158/89 this am at work. Lunch time it was 124/79- after being at home and taking 1/2 aspirin.    Currently working at [employer].    No Known Allergies Previous Medications   ATORVASTATIN (LIPITOR) 10 MG TABLET    Take 1 tablet (10 mg total) by mouth daily.   LISINOPRIL (PRINIVIL,ZESTRIL) 10 MG TABLET    Take 1 tablet (10 mg total) by mouth daily.   TAMSULOSIN (FLOMAX) 0.4 MG CAPS CAPSULE    Take 1 capsule (0.4 mg total) by mouth daily.    Review of Systems  Constitutional: Negative for chills and fever.  Gastrointestinal: Negative for diarrhea, nausea and vomiting.    Social History   Tobacco Use  . Smoking status: Former Smoker    Last attempt to quit: 10/04/1997    Years since quitting: 21.4  . Smokeless tobacco: Never Used  Substance Use Topics  . Alcohol use: No    Comment: Quit in 1999.    Objective:   There were no vitals taken for this visit.  Physical Exam  No PE.     Assessment & Plan:        Labs reviewed.   DM:  Controlled.  Encourage diabetic diet and exercise.  Continue current medication regimen.   HLD:  Controlled.  Continue current regimen.  Encourage low cholesterol,  low fat diet and exercise.   HTN:  Goal BP <130/80.  Continue current medication regimen.  Encourage low salt diet and exercise.   Discussed COVID testing through my chart.  Discussed that he is outside the 14 day exposure window- discussed continued mask use and self-quarantine if symptoms develop.     [facility]   [facility]

## 2019-05-29 ENCOUNTER — Ambulatory Visit: Payer: Self-pay | Admitting: Urology

## 2019-05-29 ENCOUNTER — Ambulatory Visit: Payer: Self-pay

## 2019-05-29 DIAGNOSIS — I10 Essential (primary) hypertension: Secondary | ICD-10-CM

## 2019-05-29 DIAGNOSIS — E1169 Type 2 diabetes mellitus with other specified complication: Secondary | ICD-10-CM

## 2019-05-29 DIAGNOSIS — E785 Hyperlipidemia, unspecified: Secondary | ICD-10-CM

## 2019-05-29 MED ORDER — TAMSULOSIN HCL 0.4 MG PO CAPS: 0.4000 mg | ORAL_CAPSULE | Freq: Every day | ORAL | 3 refills | Status: DC

## 2019-05-29 MED ORDER — LISINOPRIL 10 MG PO TABS: 10.0000 mg | ORAL_TABLET | Freq: Every day | ORAL | 3 refills | Status: DC

## 2019-05-29 MED ORDER — ATORVASTATIN CALCIUM 10 MG PO TABS: 10.0000 mg | ORAL_TABLET | Freq: Every day | ORAL | 3 refills | Status: DC

## 2019-05-29 NOTE — Progress Notes (Signed)
Virtual Visit via Telephone Note  I connected with Ladarien Beeks on 05/29/19 at  7:15 PM EDT by telephone and verified that I am speaking with the correct person using two identifiers.  Location: Patient: Home Provider: Office    I discussed the limitations, risks, security and privacy concerns of performing an evaluation and management service by telephone and the availability of in person appointments. I also discussed with the patient that there may be a patient responsible charge related to this service. The patient expressed understanding and agreed to proceed.   History of Present Illness: HTN  - good control    Observations/Objective: Does not sound distressed.    Assessment and Plan: HTN   - continue meds   Follow Up Instructions:  RTC in three months    I discussed the assessment and treatment plan with the patient. The patient was provided an opportunity to ask questions and all were answered. The patient agreed with the plan and demonstrated an understanding of the instructions.   The patient was advised to call back or seek an in-person evaluation if the symptoms worsen or if the condition fails to improve as anticipated.  I provided 15 minutes of non-face-to-face time during this encounter.   Cullin Dishman, PA-C

## 2019-06-15 ENCOUNTER — Other Ambulatory Visit: Payer: Self-pay

## 2019-06-15 ENCOUNTER — Ambulatory Visit (LOCAL_COMMUNITY_HEALTH_CENTER): Payer: Self-pay

## 2019-06-15 DIAGNOSIS — Z111 Encounter for screening for respiratory tuberculosis: Secondary | ICD-10-CM

## 2019-06-18 ENCOUNTER — Other Ambulatory Visit: Payer: Self-pay

## 2019-06-18 ENCOUNTER — Ambulatory Visit (LOCAL_COMMUNITY_HEALTH_CENTER): Payer: Self-pay

## 2019-06-18 VITALS — Wt 272.0 lb

## 2019-06-18 DIAGNOSIS — R7611 Nonspecific reaction to tuberculin skin test without active tuberculosis: Secondary | ICD-10-CM

## 2019-06-18 LAB — TB SKIN TEST
Induration: 15 mm
TB Skin Test: POSITIVE

## 2019-06-18 NOTE — Progress Notes (Signed)
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 Counseled why does not need to repeat TST in future and explained about TB Screening form which was completed today. Client aysmptomatic for TB. Rich Number, RN

## 2019-08-23 ENCOUNTER — Ambulatory Visit: Payer: Self-pay

## 2019-08-23 ENCOUNTER — Other Ambulatory Visit: Payer: Self-pay

## 2019-08-23 DIAGNOSIS — E1169 Type 2 diabetes mellitus with other specified complication: Secondary | ICD-10-CM

## 2019-08-23 NOTE — Progress Notes (Unsigned)
a1c

## 2019-08-24 LAB — COMPREHENSIVE METABOLIC PANEL
ALT: 25 IU/L (ref 0–44)
AST: 23 IU/L (ref 0–40)
Albumin/Globulin Ratio: 1.8 (ref 1.2–2.2)
Albumin: 4.4 g/dL (ref 3.8–4.9)
Alkaline Phosphatase: 77 IU/L (ref 39–117)
BUN/Creatinine Ratio: 13 (ref 9–20)
BUN: 10 mg/dL (ref 6–24)
Bilirubin Total: 0.5 mg/dL (ref 0.0–1.2)
CO2: 26 mmol/L (ref 20–29)
Calcium: 9.2 mg/dL (ref 8.7–10.2)
Chloride: 99 mmol/L (ref 96–106)
Creatinine, Ser: 0.77 mg/dL (ref 0.76–1.27)
GFR calc Af Amer: 116 mL/min/{1.73_m2} (ref >59)
GFR calc non Af Amer: 101 mL/min/{1.73_m2} (ref >59)
Globulin, Total: 2.5 g/dL (ref 1.5–4.5)
Glucose: 95 mg/dL (ref 65–99)
Potassium: 4.5 mmol/L (ref 3.5–5.2)
Sodium: 140 mmol/L (ref 134–144)
Total Protein: 6.9 g/dL (ref 6.0–8.5)

## 2019-08-24 LAB — CBC WITH DIFFERENTIAL/PLATELET
Basophils Absolute: 0 10*3/uL (ref 0.0–0.2)
Basos: 1 %
EOS (ABSOLUTE): 0.1 10*3/uL (ref 0.0–0.4)
Eos: 2 %
Hematocrit: 42.9 % (ref 37.5–51.0)
Hemoglobin: 14.4 g/dL (ref 13.0–17.7)
Immature Grans (Abs): 0 10*3/uL (ref 0.0–0.1)
Immature Granulocytes: 0 %
Lymphocytes Absolute: 1.8 10*3/uL (ref 0.7–3.1)
Lymphs: 36 %
MCH: 30.8 pg (ref 26.6–33.0)
MCHC: 33.6 g/dL (ref 31.5–35.7)
MCV: 92 fL (ref 79–97)
Monocytes Absolute: 0.3 10*3/uL (ref 0.1–0.9)
Monocytes: 6 %
Neutrophils Absolute: 2.8 10*3/uL (ref 1.4–7.0)
Neutrophils: 55 %
Platelets: 160 10*3/uL (ref 150–450)
RBC: 4.68 x10E6/uL (ref 4.14–5.80)
RDW: 13 % (ref 11.6–15.4)
WBC: 5.1 10*3/uL (ref 3.4–10.8)

## 2019-08-24 LAB — HEMOGLOBIN A1C
Est. average glucose Bld gHb Est-mCnc: 117 mg/dL
Hgb A1c MFr Bld: 5.7 % — ABNORMAL HIGH (ref 4.8–5.6)

## 2019-08-24 LAB — LIPID PANEL
Chol/HDL Ratio: 3.7 ratio (ref 0.0–5.0)
Cholesterol, Total: 154 mg/dL (ref 100–199)
HDL: 42 mg/dL (ref >39)
LDL Chol Calc (NIH): 96 mg/dL (ref 0–99)
Triglycerides: 87 mg/dL (ref 0–149)
VLDL Cholesterol Cal: 16 mg/dL (ref 5–40)

## 2019-09-06 ENCOUNTER — Other Ambulatory Visit: Payer: Self-pay

## 2019-09-06 ENCOUNTER — Ambulatory Visit: Payer: Self-pay | Admitting: Family Medicine

## 2019-09-06 VITALS — BP 126/69 | HR 57 | Ht 71.0 in | Wt 270.0 lb

## 2019-09-06 DIAGNOSIS — E782 Mixed hyperlipidemia: Secondary | ICD-10-CM

## 2019-09-06 DIAGNOSIS — R7303 Prediabetes: Secondary | ICD-10-CM

## 2019-09-06 DIAGNOSIS — I10 Essential (primary) hypertension: Secondary | ICD-10-CM

## 2019-09-06 NOTE — Progress Notes (Signed)
Established Patient Office Visit  Subjective:  Patient ID: Scott Bishop, male    DOB: 06-19-1961  Age: 58 y.o. MRN: [id number]  CC:  Chief Complaint  Patient presents with  . Hypertension  . Hyperlipidemia    HPI Scott Bishop presents for follow up on hypertension and hyperlipidemia. His last A1c was on 08/23/2019 at 5.7. Patient states that he continues to walk 3 miles a day. Normally has a healthy diet. His father recently was hospitalized and he was eating out of his normal regimen. No problems or concerns today.   Past Medical History:  Diagnosis Date  . Hypertension     Past Surgical History:  Procedure Laterality Date  . FRACTURE SURGERY  1985   Left Femur & Arm    Family History  Problem Relation Age of Onset  . Hypertension Mother   . Diabetes Mother   . Hyperlipidemia Mother   . Diabetes Father   . Hyperlipidemia Father   . Hypertension Father     Social History   Socioeconomic History  . Marital status: Married    Spouse name: Vernona Rieger   . Number of children: 3  . Years of education: Not on file  . Highest education level: Bachelor's degree (e.g., BA, AB, BS)  Occupational History  . Not on file  Social Needs  . Financial resource strain: Not very hard  . Food insecurity    Worry: Never true    Inability: Never true  . Transportation needs    Medical: No    Non-medical: No  Tobacco Use  . Smoking status: Former Smoker    Quit date: 10/04/1997    Years since quitting: 21.9  . Smokeless tobacco: Never Used  Substance and Sexual Activity  . Alcohol use: No    Comment: Quit in 1999.   . Drug use: No  . Sexual activity: Not on file  Lifestyle  . Physical activity    Days per week: Not on file    Minutes per session: Not on file  . Stress: Not on file  Relationships  . Social connections    Talks on phone: More than three times a week    Gets together: Once a week    Attends religious service: 1 to 4 times per year    Active member of club  or organization: No    Attends meetings of clubs or organizations: Never    Relationship status: Married  . Intimate partner violence    Fear of current or ex partner: No    Emotionally abused: No    Physically abused: No    Forced sexual activity: No  Other Topics Concern  . Not on file  Social History Narrative   PT is working at [employer].     Outpatient Medications Prior to Visit  Medication Sig Dispense Refill  . atorvastatin (LIPITOR) 10 MG tablet Take 1 tablet (10 mg total) by mouth daily. 90 tablet 3  . lisinopril (ZESTRIL) 10 MG tablet Take 1 tablet (10 mg total) by mouth daily. 90 tablet 3  . tamsulosin (FLOMAX) 0.4 MG CAPS capsule Take 1 capsule (0.4 mg total) by mouth daily. 90 capsule 3   No facility-administered medications prior to visit.     No Known Allergies  ROS Review of Systems  Constitutional: Negative.   HENT: Negative.   Eyes: Negative.   Respiratory: Negative.   Cardiovascular: Negative.   Gastrointestinal: Negative.   Endocrine: Negative.   Genitourinary: Negative.   Musculoskeletal: Negative.  Skin: Negative.   Allergic/Immunologic: Negative.   Neurological: Negative.   Hematological: Negative.   Psychiatric/Behavioral: Negative.       Objective:    Physical Exam  Constitutional: He is oriented to person, place, and time. He appears well-developed and well-nourished. No distress.  HENT:  Head: Normocephalic and atraumatic.  Eyes: Pupils are equal, round, and reactive to light. Conjunctivae and EOM are normal.  Neck: Normal range of motion.  Cardiovascular: Normal rate, regular rhythm and normal heart sounds.  Pulmonary/Chest: Effort normal and breath sounds normal. No respiratory distress.  Musculoskeletal: Normal range of motion.  Neurological: He is alert and oriented to person, place, and time.  Skin: Skin is warm and dry.  Psychiatric: He has a normal mood and affect. His behavior is normal. Judgment and thought content normal.   Nursing note and vitals reviewed.   BP 126/69 (BP Location: Left Arm, Patient Position: Sitting)   Pulse (!) 57   Ht 5\' 11"  (1.803 m)   Wt 270 lb (122.5 kg)   SpO2 98%   BMI 37.66 kg/m  Wt Readings from Last 3 Encounters:  09/06/19 270 lb (122.5 kg)  06/18/19 272 lb (123.4 kg)  10/17/18 275 lb (124.7 kg)     Health Maintenance Due  Topic Date Due  . Hepatitis C Screening  1961-01-26  . PNEUMOCOCCAL POLYSACCHARIDE VACCINE AGE 26-64 HIGH RISK  02/07/1964  . FOOT EXAM  02/07/1972  . OPHTHALMOLOGY EXAM  02/07/1972  . HIV Screening  02/06/1977  . TETANUS/TDAP  02/06/1981  . COLONOSCOPY  02/07/2012    There are no preventive care reminders to display for this patient.  Lab Results  Component Value Date   TSH 1.600 02/20/2019   Lab Results  Component Value Date   WBC 5.1 08/23/2019   HGB 14.4 08/23/2019   HCT 42.9 08/23/2019   MCV 92 08/23/2019   PLT 160 08/23/2019   Lab Results  Component Value Date   NA 140 08/23/2019   K 4.5 08/23/2019   CO2 26 08/23/2019   GLUCOSE 95 08/23/2019   BUN 10 08/23/2019   CREATININE 0.77 08/23/2019   BILITOT 0.5 08/23/2019   ALKPHOS 77 08/23/2019   AST 23 08/23/2019   ALT 25 08/23/2019   PROT 6.9 08/23/2019   ALBUMIN 4.4 08/23/2019   CALCIUM 9.2 08/23/2019   Lab Results  Component Value Date   CHOL 154 08/23/2019   Lab Results  Component Value Date   HDL 42 08/23/2019   Lab Results  Component Value Date   LDLCALC 96 08/23/2019   Lab Results  Component Value Date   TRIG 87 08/23/2019   Lab Results  Component Value Date   CHOLHDL 3.7 08/23/2019   Lab Results  Component Value Date   HGBA1C 5.7 (H) 08/23/2019      Assessment & Plan:   Problem List Items Addressed This Visit      Cardiovascular and Mediastinum   Hypertension - Primary   Relevant Orders   CBC With Differential - Future   Comprehensive metabolic panel   Lipid Panel With LDL/HDL Ratio   HgB A1c     Other   Prediabetes   Relevant Orders    HgB A1c   Hyperlipidemia      No orders of the defined types were placed in this encounter.   Follow-up: Return in about 3 months (around 12/05/2019) for hTn.  1week prior to visit for labwork.   Lanae Boast, FNP

## 2019-11-01 ENCOUNTER — Ambulatory Visit: Payer: Self-pay | Attending: Internal Medicine

## 2019-11-01 DIAGNOSIS — Z20822 Contact with and (suspected) exposure to covid-19: Secondary | ICD-10-CM | POA: Insufficient documentation

## 2019-11-02 LAB — NOVEL CORONAVIRUS, NAA: SARS-CoV-2, NAA: NOT DETECTED

## 2019-12-06 ENCOUNTER — Ambulatory Visit: Payer: Self-pay | Attending: Internal Medicine

## 2019-12-06 DIAGNOSIS — Z23 Encounter for immunization: Secondary | ICD-10-CM | POA: Insufficient documentation

## 2019-12-06 NOTE — Progress Notes (Signed)
   Covid-19 Vaccination Clinic  Name:  Scott Bishop    MRN: [id number] DOB: Nov 23, 1960  12/06/2019  [name] was observed post Covid-19 immunization for 15 minutes without incident. He was provided with Vaccine Information Sheet and instruction to access the V-Safe system.   [name] was instructed to call 911 with any severe reactions post vaccine: Marland Kitchen Difficulty breathing  . Swelling of face and throat  . A fast heartbeat  . A bad rash all over body  . Dizziness and weakness   Immunizations Administered    Name Date Dose VIS Date Route   Pfizer COVID-19 Vaccine 12/06/2019  8:20 AM 0.3 mL 09/14/2019 Intramuscular   Manufacturer: [employer], [employer]   Lot: [id number]   NDC: [address]-6

## 2019-12-27 ENCOUNTER — Ambulatory Visit: Payer: Self-pay

## 2019-12-29 ENCOUNTER — Ambulatory Visit: Payer: Self-pay

## 2020-01-03 ENCOUNTER — Ambulatory Visit: Payer: Self-pay

## 2020-01-09 ENCOUNTER — Ambulatory Visit: Payer: Self-pay | Attending: Internal Medicine

## 2020-01-09 DIAGNOSIS — Z23 Encounter for immunization: Secondary | ICD-10-CM

## 2020-01-09 NOTE — Progress Notes (Signed)
   Covid-19 Vaccination Clinic  Name:  Stein Windhorst    MRN: [id number] DOB: October 29, 1960  01/09/2020  Mr. Virden was observed post Covid-19 immunization for 15 minutes without incident. He was provided with Vaccine Information Sheet and instruction to access the V-Safe system.   Mr. Taft was instructed to call 911 with any severe reactions post vaccine: Marland Kitchen Difficulty breathing  . Swelling of face and throat  . A fast heartbeat  . A bad rash all over body  . Dizziness and weakness   Immunizations Administered    Name Date Dose VIS Date Route   Pfizer COVID-19 Vaccine 01/09/2020  3:31 PM 0.3 mL 09/14/2019 Intramuscular   Manufacturer: [employer], [employer]   Lot: [id number]   NDC: [address]-1

## 2020-01-10 ENCOUNTER — Ambulatory Visit: Payer: Self-pay

## 2020-01-17 ENCOUNTER — Ambulatory Visit: Payer: Self-pay | Admitting: Urology

## 2020-01-17 ENCOUNTER — Other Ambulatory Visit: Payer: Self-pay

## 2020-01-17 VITALS — BP 137/78 | HR 59 | Ht 71.0 in | Wt 289.9 lb

## 2020-01-17 DIAGNOSIS — R7303 Prediabetes: Secondary | ICD-10-CM

## 2020-01-17 DIAGNOSIS — N401 Enlarged prostate with lower urinary tract symptoms: Secondary | ICD-10-CM

## 2020-01-17 DIAGNOSIS — I10 Essential (primary) hypertension: Secondary | ICD-10-CM

## 2020-01-17 DIAGNOSIS — E1169 Type 2 diabetes mellitus with other specified complication: Secondary | ICD-10-CM

## 2020-01-17 DIAGNOSIS — E785 Hyperlipidemia, unspecified: Secondary | ICD-10-CM

## 2020-01-17 DIAGNOSIS — N138 Other obstructive and reflux uropathy: Secondary | ICD-10-CM

## 2020-01-17 MED ORDER — TAMSULOSIN HCL 0.4 MG PO CAPS: 0.4000 mg | ORAL_CAPSULE | Freq: Every day | ORAL | 3 refills | Status: DC

## 2020-01-17 MED ORDER — LISINOPRIL 10 MG PO TABS: 10.0000 mg | ORAL_TABLET | Freq: Every day | ORAL | 3 refills | Status: DC

## 2020-01-17 MED ORDER — ATORVASTATIN CALCIUM 10 MG PO TABS: 10.0000 mg | ORAL_TABLET | Freq: Every day | ORAL | 3 refills | Status: DC

## 2020-01-17 NOTE — Progress Notes (Addendum)
  Patient: Scott Bishop Male    DOB: 05/19/1961   59 y.o.   MRN: [id number] Visit Date: 01/17/2020  Today's Provider: [facility]   Chief Complaint  Patient presents with  . Follow-up   Subjective:    HPI  HTN  At goal < 140/90  HLD  Labs pending  Prediabetic  Labs pending  BPH with LU TS  PSA pending  No issues tonight - no pain with urination - no gross hematuria  No Known Allergies Previous Medications   No medications on file    Review of Systems  Social History   Tobacco Use  . Smoking status: Former Smoker    Quit date: 10/04/1997    Years since quitting: 22.3  . Smokeless tobacco: Never Used  Substance Use Topics  . Alcohol use: No    Comment: Quit in 1999.    Objective:   BP 137/78 (BP Location: Right Arm, Patient Position: Sitting)   Pulse (!) 59   Ht 5\' 11"  (1.803 m)   Wt 289 lb 14.4 oz (131.5 kg)   BMI 40.43 kg/m   Physical Exam      Assessment & Plan:   1. Hyperlipidemia associated with type 2 diabetes mellitus (H[facility]) - labs pending - atorvastatin (LIPITOR) 10 MG tablet; Take 1 tablet (10 mg total) by mouth daily.  Dispense: 90 tablet; Refill: 3  2. Hypertension, unspecified type - at goal - lisinopril (ZESTRIL) 10 MG tablet; Take 1 tablet (10 mg total) by mouth daily.  Dispense: 90 tablet; Refill: 3 - tamsulosin (FLOMAX) 0.4 MG CAPS capsule; Take 1 capsule (0.4 mg total) by mouth daily.  Dispense: 90 capsule; Refill: 3  3. Prediabetes - labs pending   4. BPH with obstruction/lower urinary tract symptoms - PSA  [facility]   [facility]

## 2020-01-18 LAB — COMPREHENSIVE METABOLIC PANEL
ALT: 27 IU/L (ref 0–44)
AST: 19 IU/L (ref 0–40)
Albumin/Globulin Ratio: 1.8 (ref 1.2–2.2)
Albumin: 4.6 g/dL (ref 3.8–4.9)
Alkaline Phosphatase: 68 IU/L (ref 39–117)
BUN/Creatinine Ratio: 32 — ABNORMAL HIGH (ref 9–20)
BUN: 22 mg/dL (ref 6–24)
Bilirubin Total: 0.4 mg/dL (ref 0.0–1.2)
CO2: 25 mmol/L (ref 20–29)
Calcium: 9.1 mg/dL (ref 8.7–10.2)
Chloride: 105 mmol/L (ref 96–106)
Creatinine, Ser: 0.68 mg/dL — ABNORMAL LOW (ref 0.76–1.27)
GFR calc Af Amer: 123 mL/min/{1.73_m2} (ref >59)
GFR calc non Af Amer: 106 mL/min/{1.73_m2} (ref >59)
Globulin, Total: 2.5 g/dL (ref 1.5–4.5)
Glucose: 99 mg/dL (ref 65–99)
Potassium: 4.3 mmol/L (ref 3.5–5.2)
Sodium: 144 mmol/L (ref 134–144)
Total Protein: 7.1 g/dL (ref 6.0–8.5)

## 2020-01-18 LAB — CBC WITH DIFFERENTIAL
Basophils Absolute: 0 10*3/uL (ref 0.0–0.2)
Basos: 1 %
EOS (ABSOLUTE): 0.1 10*3/uL (ref 0.0–0.4)
Eos: 2 %
Hematocrit: 41.3 % (ref 37.5–51.0)
Hemoglobin: 14.4 g/dL (ref 13.0–17.7)
Immature Grans (Abs): 0 10*3/uL (ref 0.0–0.1)
Immature Granulocytes: 0 %
Lymphocytes Absolute: 2 10*3/uL (ref 0.7–3.1)
Lymphs: 38 %
MCH: 31.4 pg (ref 26.6–33.0)
MCHC: 34.9 g/dL (ref 31.5–35.7)
MCV: 90 fL (ref 79–97)
Monocytes Absolute: 0.4 10*3/uL (ref 0.1–0.9)
Monocytes: 8 %
Neutrophils Absolute: 2.7 10*3/uL (ref 1.4–7.0)
Neutrophils: 51 %
RBC: 4.59 x10E6/uL (ref 4.14–5.80)
RDW: 13.1 % (ref 11.6–15.4)
WBC: 5.3 10*3/uL (ref 3.4–10.8)

## 2020-01-18 LAB — LIPID PANEL WITH LDL/HDL RATIO
Cholesterol, Total: 153 mg/dL (ref 100–199)
HDL: 41 mg/dL (ref >39)
LDL Chol Calc (NIH): 93 mg/dL (ref 0–99)
LDL/HDL Ratio: 2.3 ratio (ref 0.0–3.6)
Triglycerides: 104 mg/dL (ref 0–149)
VLDL Cholesterol Cal: 19 mg/dL (ref 5–40)

## 2020-01-18 LAB — HEMOGLOBIN A1C
Est. average glucose Bld gHb Est-mCnc: 114 mg/dL
Hgb A1c MFr Bld: 5.6 % (ref 4.8–5.6)

## 2020-07-17 ENCOUNTER — Ambulatory Visit: Payer: Self-pay

## 2021-03-23 ENCOUNTER — Other Ambulatory Visit: Payer: Self-pay | Admitting: Urology

## 2021-03-23 DIAGNOSIS — I10 Essential (primary) hypertension: Secondary | ICD-10-CM

## 2021-04-23 ENCOUNTER — Ambulatory Visit: Payer: Self-pay | Admitting: Gerontology

## 2021-04-23 ENCOUNTER — Other Ambulatory Visit: Payer: Self-pay

## 2021-04-23 VITALS — BP 149/82 | HR 69 | Temp 96.9°F | Resp 16 | Wt 278.4 lb

## 2021-04-23 DIAGNOSIS — Z0000 Encounter for general adult medical examination without abnormal findings: Secondary | ICD-10-CM

## 2021-04-23 DIAGNOSIS — E785 Hyperlipidemia, unspecified: Secondary | ICD-10-CM

## 2021-04-23 DIAGNOSIS — R0981 Nasal congestion: Secondary | ICD-10-CM

## 2021-04-23 DIAGNOSIS — R7303 Prediabetes: Secondary | ICD-10-CM

## 2021-04-23 DIAGNOSIS — I10 Essential (primary) hypertension: Secondary | ICD-10-CM

## 2021-04-23 DIAGNOSIS — N138 Other obstructive and reflux uropathy: Secondary | ICD-10-CM

## 2021-04-23 DIAGNOSIS — H9192 Unspecified hearing loss, left ear: Secondary | ICD-10-CM | POA: Insufficient documentation

## 2021-04-23 DIAGNOSIS — E1169 Type 2 diabetes mellitus with other specified complication: Secondary | ICD-10-CM

## 2021-04-23 LAB — POCT GLYCOSYLATED HEMOGLOBIN (HGB A1C): Hemoglobin A1C: 5.8 % — AB (ref 4.0–5.6)

## 2021-04-23 LAB — GLUCOSE, POCT (MANUAL RESULT ENTRY): POC Glucose: 95 mg/dl (ref 70–99)

## 2021-04-23 MED ORDER — ATORVASTATIN CALCIUM 10 MG PO TABS: 10.0000 mg | ORAL_TABLET | Freq: Every day | ORAL | 1 refills | Status: DC

## 2021-04-23 MED ORDER — TAMSULOSIN HCL 0.4 MG PO CAPS: 0.4000 mg | ORAL_CAPSULE | Freq: Every day | ORAL | 1 refills | Status: AC

## 2021-04-23 MED ORDER — LISINOPRIL 20 MG PO TABS: 20.0000 mg | ORAL_TABLET | Freq: Every day | ORAL | 0 refills | Status: DC

## 2021-04-23 MED ORDER — LISINOPRIL 10 MG PO TABS: 10.0000 mg | ORAL_TABLET | Freq: Every day | ORAL | 1 refills | Status: DC

## 2021-04-23 MED ORDER — FLUTICASONE PROPIONATE 50 MCG/ACT NA SUSP: 2.0000 | Freq: Every day | NASAL | 0 refills | Status: DC

## 2021-04-23 NOTE — Progress Notes (Signed)
Established Patient Office Visit  Subjective:  Patient ID: Scott Bishop, male    DOB: Mar 09, 1961  Age: 60 y.o. MRN: [id number]  CC: No chief complaint on file.   HPI Scott Bishop  is a 60 y/o male who has history of Hypertension, hyperlipidemia, presents for  routine follow up and medication refill. He states that he's compliant with his medications,  denies any side effects and checks his blood pressure at least twice weekly and continues to work on his diet. He denies chest pain, palpitation, headache and vision changes. He states that he was experiencing pain inside to his left ear canal and decrease hearing to the left ear.  He reports cleaning his left ear with his finger and in the process pushed hard inside his left ear 10 days ago. He states that afterwards he realized that his hearing has diminished in his left ear. Also he c/o nasal congestion that started 10 days ago too. He denies rhinorrhea, post nasal drip, sinus pressure and cough . Currently, he states that hearing to his left ear has improved 75%. His HgbA1c done during visit was 5.8% and his blood glucose was 95 mg/dl. Overall, he states that he's doing well and offers no further complaint.    Past Medical History:  Diagnosis Date   Hypertension     Past Surgical History:  Procedure Laterality Date   FRACTURE SURGERY  1985   Left Femur & Arm    Family History  Problem Relation Age of Onset   Hypertension Mother    Diabetes Mother    Hyperlipidemia Mother    Diabetes Father    Hyperlipidemia Father    Hypertension Father     Social History   Socioeconomic History   Marital status: Married    Spouse name: Mickel Baas    Number of children: 3   Years of education: Not on file   Highest education level: Bachelor's degree (e.g., BA, AB, BS)  Occupational History   Not on file  Tobacco Use   Smoking status: Former    Types: Cigarettes    Quit date: 10/04/1997    Years since quitting: 23.5   Smokeless tobacco:  Never  Substance and Sexual Activity   Alcohol use: No    Comment: Quit in 1999.    Drug use: No   Sexual activity: Not on file  Other Topics Concern   Not on file  Social History Narrative   PT is working at [employer].    Social Determinants of Health   Financial Resource Strain: Not on file  Food Insecurity: Not on file  Transportation Needs: Not on file  Physical Activity: Not on file  Stress: Not on file  Social Connections: Not on file  Intimate Partner Violence: Not on file    Outpatient Medications Prior to Visit  Medication Sig Dispense Refill   atorvastatin (LIPITOR) 10 MG tablet Take 1 tablet (10 mg total) by mouth daily. 90 tablet 3   lisinopril (ZESTRIL) 10 MG tablet Take 1 tablet (10 mg total) by mouth daily. 90 tablet 3   tamsulosin (FLOMAX) 0.4 MG CAPS capsule Take 1 capsule (0.4 mg total) by mouth daily. 90 capsule 3   No facility-administered medications prior to visit.    No Known Allergies  ROS Review of Systems  Constitutional: Negative.   HENT:  Positive for hearing loss (decreased in left ear). Negative for ear pain.   Eyes: Negative.   Respiratory: Negative.    Cardiovascular: Negative.  Endocrine: Negative.   Skin: Negative.   Neurological: Negative.   Psychiatric/Behavioral: Negative.       Objective:    Physical Exam HENT:     Head: Normocephalic and atraumatic.     Right Ear: Hearing normal.     Left Ear: Decreased hearing noted. No laceration, drainage, swelling or tenderness.  No middle ear effusion. There is no impacted cerumen. No foreign body. Tympanic membrane is not scarred, perforated or erythematous.     Nose: Nose normal.     Mouth/Throat:     Mouth: Mucous membranes are moist.  Eyes:     Extraocular Movements: Extraocular movements intact.     Conjunctiva/sclera: Conjunctivae normal.     Pupils: Pupils are equal, round, and reactive to light.  Cardiovascular:     Rate and Rhythm: Normal rate and regular rhythm.      Pulses: Normal pulses.     Heart sounds: Normal heart sounds.  Pulmonary:     Effort: Pulmonary effort is normal.     Breath sounds: Normal breath sounds.  Skin:    General: Skin is warm.  Neurological:     General: No focal deficit present.     Mental Status: He is alert and oriented to person, place, and time. Mental status is at baseline.  Psychiatric:        Mood and Affect: Mood normal.        Behavior: Behavior normal.        Thought Content: Thought content normal.        Judgment: Judgment normal.    BP (!) 149/82 (BP Location: Left Arm, Patient Position: Sitting, Cuff Size: Large)   Pulse 69   Temp (!) 96.9 F (36.1 C)   Resp 16   Wt 278 lb 6.4 oz (126.3 kg)   SpO2 97%   BMI 38.83 kg/m  Wt Readings from Last 3 Encounters:  04/23/21 278 lb 6.4 oz (126.3 kg)  01/17/20 289 lb 14.4 oz (131.5 kg)  09/06/19 270 lb (122.5 kg)     Health Maintenance Due  Topic Date Due   PNEUMOCOCCAL POLYSACCHARIDE VACCINE AGE 62-64 HIGH RISK  Never done   FOOT EXAM  Never done   OPHTHALMOLOGY EXAM  Never done   HIV Screening  Never done   Hepatitis C Screening  Never done   TETANUS/TDAP  Never done   COLONOSCOPY (Pts 45-69yr Insurance coverage will need to be confirmed)  Never done   Zoster Vaccines- Shingrix (1 of 2) Never done   COVID-19 Vaccine (3 - Booster for PMontegutseries) 06/10/2020    There are no preventive care reminders to display for this patient.  Lab Results  Component Value Date   TSH 1.600 02/20/2019   Lab Results  Component Value Date   WBC 5.3 01/17/2020   HGB 14.4 01/17/2020   HCT 41.3 01/17/2020   MCV 90 01/17/2020   PLT 160 08/23/2019   Lab Results  Component Value Date   NA 144 01/17/2020   K 4.3 01/17/2020   CO2 25 01/17/2020   GLUCOSE 99 01/17/2020   BUN 22 01/17/2020   CREATININE 0.68 (L) 01/17/2020   BILITOT 0.4 01/17/2020   ALKPHOS 68 01/17/2020   AST 19 01/17/2020   ALT 27 01/17/2020   PROT 7.1 01/17/2020   ALBUMIN 4.6 01/17/2020    CALCIUM 9.1 01/17/2020   Lab Results  Component Value Date   CHOL 153 01/17/2020   Lab Results  Component Value Date   HDL  41 01/17/2020   Lab Results  Component Value Date   LDLCALC 93 01/17/2020   Lab Results  Component Value Date   TRIG 104 01/17/2020   Lab Results  Component Value Date   CHOLHDL 3.7 08/23/2019   Lab Results  Component Value Date   HGBA1C 5.8 (A) 04/23/2021      Assessment & Plan:    1. Hypertension, unspecified type -His blood pressure is not under control, his goal should be less than 140/90.  His lisinopril was increased to 20 mg daily.  He was advised to check his blood pressure at least 3 times a week, record and bring the log to follow-up appointment.  He was advised to continue on DASH diet and exercise as tolerated. - tamsulosin (FLOMAX) 0.4 MG CAPS capsule; Take 1 capsule (0.4 mg total) by mouth daily.  Dispense: 90 capsule; Refill: 1 - lisinopril (ZESTRIL) 20 MG tablet; Take 1 tablet (20 mg total) by mouth daily.  Dispense: 90 tablet; Refill: 0 - EKG 12-Lead will be done during his next visit.  2. Hyperlipidemia associated with type 2 diabetes mellitus ([place]) -He will continue on current medication, low-fat cholesterol diet and exercise as tolerated. - atorvastatin (LIPITOR) 10 MG tablet; Take 1 tablet (10 mg total) by mouth daily.  Dispense: 90 tablet; Refill: 1  3. Prediabetes -His hemoglobin A1c will be checked. - POCT HgB A1C; Future - POCT Glucose (CBG); Future - POCT Glucose (CBG) - POCT HgB A1C  4. Nasal congestion -He will continue on Flonase for 3 days, and was advised to notify clinic for worsening symptoms. - fluticasone (FLONASE) 50 MCG/ACT nasal spray; Place 2 sprays into both nostrils daily.  Dispense: 16 g; Refill: 0  5 Decreased hearing of left ear -There was no perforation on, object, cerumen to his left ear.  He states that his hearing to his left ear has improved 75%.  He was advised to notify clinic for worsening  symptoms or go to the emergency room.  6. Health care maintenance -Routine labs will be checked, he was advised to complete the financial application for referral to [facility]. - Comp Met (CMET); Future - CBC w/Diff; Future - Lipid panel; Future - PSA; Future - Urinalysis; Future - Urinalysis - PSA - Lipid panel - CBC w/Diff - Comp Met (CMET)     Follow-up: Return in about 26 days (around 05/19/2021), or if symptoms worsen or fail to improve.    Nasirah Sachs Jerold Coombe, NP

## 2021-04-23 NOTE — Patient Instructions (Signed)
https://www.nhlbi.nih.gov/files/docs/public/heart/dash_brief.pdf">  Plan de alimentacin DASH DASH Eating Plan DASH es la sigla en ingls de "Enfoques Alimentarios para Detener la Hipertensin". El plan de alimentacin DASH ha demostrado: [place]aj[place] la presin arterial elevada (hipertensin). Reducir el riesgo de diabetes tipo 2, enfermedad cardaca y accidente cerebrovascular. Ayudar a perder peso. Consejos para seguir este plan Leer las etiquetas de los alimentos Verifique la cantidad de sal (sodio) por porcin en las etiquetas de los alimentos. Elija alimentos con menos del 5 por ciento del valor diario de sodio. Generalmente, los alimentos con menos de 300 miligramos (mg) de sodio por porcin se encuadran dentro de este plan alimentario. Para encontrar cereales integrales, busque la palabra "integral" como primera palabra en la lista de ingredientes. Al ir de compras Compre productos en los que en su etiqueta diga: "bajo contenido de sodio" o "sin agregado de sal". Compre alimentos frescos. Evite los alimentos enlatados y comidas precocidas o congeladas. Al cocinar Evite agregar sal cuando cocine. Use hierbas o aderezos sin sal, en lugar de sal de mesa o sal marina. Consulte al mdico o farmacutico antes de usar sustitutos de la sal. No fra los alimentos. A la hora de cocinar los alimentos opte por hornearlos, hervirlos, grillarlos, asarlos al horno y asarlos a la parrilla. Cocine con aceites cardiosaludables, como oliva, canola, aguacate, soja o girasol. Planificacin de las comidas  Consuma una dieta equilibrada, que incluya lo siguiente: 4 o ms porciones de frutas y 4 o ms porciones de verduras por da. Trate de que medio plato de cada comida sea de frutas y verduras. De 6 a 8 porciones de cereales integrales todos los das. Menos de 6 onzas (170 g) de carne, aves o pescado magros por da. Una porcin de 3 onzas (85 g) de carne tiene casi el mismo tamao que un mazo de cartas. Un huevo  equivale a 1 onza (28 g). De 2 a 3 porciones de productos lcteos descremados por da. Una porcin es 1 taza (237 ml). 1 porcin de frutos secos, semillas o frijoles 5 veces por semana. De 2 a 3 porciones de grasas cardiosaludables. Las grasas saludables llamadas cidos grasos omega-3 se encuentran en alimentos como las nueces, las semillas de lino, las leches fortificadas y los huevos. Estas grasas tambin se encuentran en los pescados de agua fra, como la sardina, el salmn y la caballa. Limite la cantidad que consume de: Alimentos enlatados o envasados. Alimentos con alto contenido de grasa trans, como algunos alimentos fritos. Alimentos con alto contenido de grasa saturada, como carne con grasa. Postres y otros dulces, bebidas azucaradas y otros alimentos con azcar agregada. Productos lcteos enteros. No le agregue sal a los alimentos antes de probarlos. No coma ms de 4 yemas de huevo por semana. Trate de comer al menos 2 comidas vegetarianas por semana. Consuma ms comida casera y menos de restaurante, de bares y comida rpida.  Estilo de vida Cuando coma en un restaurante, pida que preparen su comida con menos sal o, en lo posible, sin nada de sal. Si bebe alcohol: Limite la cantidad que bebe: De 0 a 1 medida por da para las mujeres que no estn embarazadas. De 0 a 2 medidas por da para los hombres. Est atento a la cantidad de alcohol que hay en las bebidas que toma. En los [place], una medida equivale a una botella de cerveza de 12 oz (355 ml), un vaso de vino de 5 oz (148 ml) o un vaso de una bebida alcohlica de alta graduacin   de 1 oz (44 ml). Informacin general Evite ingerir ms de 2300 mg de sal por da. Si tiene hipertensin, es posible que necesite reducir la ingesta de sodio a 1,500 mg por da. Trabaje con su mdico para mantener un peso saludable o perder peso. Pregntele cul es el peso recomendado para usted. Realice al menos 30 minutos de ejercicio que haga  que se acelere su corazn (ejercicio aerbico) la mayora de los das de la semana. Estas actividades pueden incluir caminar, nadar o andar en bicicleta. Trabaje con su mdico o nutricionista para ajustar su plan alimentario a sus necesidades calricas personales. Qu alimentos debo comer? Frutas Todas las frutas frescas, congeladas o disecadas. Frutas enlatadas en jugonatural (sin agregado de azcar). Verduras Verduras frescas o congeladas (crudas, al vapor, asadas o grilladas). Jugos de tomate y verduras con bajo contenido de sodio o reducidos en sodio. Salsa y pasta de tomate con bajo contenido de sodio o reducidas en sodio. Verdurasenlatadas con bajo contenido de sodio o reducidas en sodio. Granos Pan de salvado o integral. Pasta de salvado o integral. Arroz integral. Avena. Quinua. Trigo burgol. Cereales integrales y con bajo contenido de sodio. Pan pita. Galletitas de agua con bajo contenido de grasa y sodio. Tortillas deharina integral. Carnes y otras protenas Pollo o pavo sin piel. Carne de pollo o de pavo molida. Cerdo desgrasado. Pescado y mariscos. Claras de huevo. Porotos, guisantes o lentejas secos. Frutos secos, mantequilla de frutos secos y semillas sin sal. Frijoles enlatados sin sal. Cortes de carne vacuna magra, desgrasada. Carne precocida ocurada magra y baja en sodio, como embutidos o panes de carne. Lcteos Leche descremada (1 %) o descremada. Quesos reducidos en grasa, con bajo contenido de grasa o descremados. Queso blanco o ricota sin grasa, con bajo contenido de sodio. Yogur semidescremado o descremado. Queso con bajo contenidode grasa y sodio. Grasas y aceites Margarinas untables que no contengan grasas trans. Aceite vegetal. Mayonesa y aderezos para ensaladas livianos, reducidos en grasa o con bajo contenido de grasas (reducidos en sodio). Aceite de canola,crtamo, oliva, aguacate, soja y girasol. Aguacate. Alios y condimentos Hierbas. Especias. Mezclas de condimentos  sin sal. Otros alimentos Palomitas de maz y pretzels sin sal. Dulces con bajo contenido de grasas. Es posible que los productos que se enumeran ms arriba no constituyan una lista completa de los alimentos y las bebidas que puede tomar. Consulte a un nutricionista para obtener ms informacin. Qu alimentos debo evitar? Frutas Fruta enlatada en almbar liviano o espeso. Frutas cocidas en aceite. Frutascon salsa de crema o mantequilla. Verduras Verduras con crema o fritas. Verduras en salsa de queso. Verduras enlatadas regulares (que no sean con bajo contenido de sodio o reducidas en sodio). Pasta y salsa de tomates enlatadas regulares (que no sean con bajo contenido de sodio o reducidas en sodio). Jugos de tomate y verduras regulares (que no sean conbajo contenido de sodio o reducidos en sodio). Pepinillos. Aceitunas. Granos Productos de panificacin hechos con grasa, como medialunas, magdalenas yalgunos panes. Comidas con arroz o pasta seca listas para usar. Carnes y otras protenas Cortes de carne con alto contenido de grasa. Costillas. Carne frita. Tocino. [place], salame y otras carnes precocidas o curadas, como embutidos o panes de carne. Grasa de la espalda del cerdo (panceta). Salchicha de cerdo. Frutos secos y semillas con sal. Frijoles enlatados con agregado de sal. Pescado enlatado o ahumado. Huevos enteros o yemas. Pollo opavo con piel. Lcteos Leche entera o al 2 %, crema y mitad leche y mitad crema. Queso   crema entero o con toda su grasa. Yogur entero o endulzado. Quesos con toda su grasa. Sustitutos de cremas no lcteas. Coberturas batidas. Quesos para untar y quesosprocesados. Grasas y aceites Mantequilla. Margarina en barra. Manteca de cerdo. Lardo. Mantequilla clarificada. Grasa de panceta. Aceites tropicales como aceite de coco, palmisteo palma. Alios y condimentos Sal de cebolla, sal de ajo, sal condimentada, sal de mesa y sal marina. Salsa [place]. Salsa trtara.  Salsa barbacoa. Salsa teriyaki. Salsa de soja, incluso la que tiene contenido reducido de sodio. Salsa de carne. Salsas en lata y envasadas. Salsa de pescado. Salsa de ostras. Salsa rosada. Rbanos picantes comprados en tiendas. [place]. Mostaza. Saborizantes y tiernizantes para carne. Caldo en cubitos. Salsas picantes. Adobos preelaborados o envasados. Aderezos para tacos preelaborados o envasados. Salsas de pepinillos.Aderezos comunes para ensalada. Otros alimentos Palomitas de maz y pretzels con sal. Es posible que los productos que se enumeran ms arriba no constituyan una lista completa de los alimentos y las bebidas que debe evitar. Consulte a un nutricionista para obtener ms informacin. Dnde buscar ms informacin National Heart, Lung, and Blood Institute (Instituto Nacional del Corazn, los Pulmones y la Sangre): www.nhlbi.nih.gov American Heart Association (Asociacin Estadounidense del Corazn): www.heart.org Academy of Nutrition and Dietetics (Academia de Nutricin y Diettica): www.eatright.org National Kidney Foundation (Fundacin Nacional del Rin): www.kidney.org Resumen El plan de alimentacin DASH ha demostrado bajar la presin arterial elevada (hipertensin). Tambin puede reducir el riesgo de diabetes tipo 2, enfermedad cardaca y accidente cerebrovascular. Cuando siga el plan de alimentacin DASH, trate de comer ms frutas frescas y verduras, cereales integrales, carnes magras, lcteos descremados y grasas cardiosaludables. Con el plan de alimentacin DASH, deber limitar el consumo de sal (sodio) a 2,300 mg por da. Si tiene hipertensin, es posible que necesite reducir la ingesta de sodio a 1,500 mg por da. Trabaje con su mdico o nutricionista para ajustar su plan alimentario a sus necesidades calricas personales. Esta informacin no tiene como fin reemplazar el consejo del mdico. Asegresede hacerle al mdico cualquier pregunta que tenga. Document Revised: 10/25/2019  Document Reviewed: 10/25/2019 Elsevier Patient Education  2022 Elsevier Inc.  

## 2021-04-24 LAB — URINALYSIS
Bilirubin, UA: NEGATIVE
Glucose, UA: NEGATIVE
Ketones, UA: NEGATIVE
Leukocytes,UA: NEGATIVE
Nitrite, UA: NEGATIVE
Protein,UA: NEGATIVE
RBC, UA: NEGATIVE
Specific Gravity, UA: 1.02 (ref 1.005–1.030)
Urobilinogen, Ur: 0.2 mg/dL (ref 0.2–1.0)
pH, UA: 5 (ref 5.0–7.5)

## 2021-04-24 LAB — CBC WITH DIFFERENTIAL/PLATELET
Basophils Absolute: 0 10*3/uL (ref 0.0–0.2)
Basos: 1 %
EOS (ABSOLUTE): 0.1 10*3/uL (ref 0.0–0.4)
Eos: 1 %
Hematocrit: 45.2 % (ref 37.5–51.0)
Hemoglobin: 15.2 g/dL (ref 13.0–17.7)
Immature Grans (Abs): 0 10*3/uL (ref 0.0–0.1)
Immature Granulocytes: 0 %
Lymphocytes Absolute: 2.3 10*3/uL (ref 0.7–3.1)
Lymphs: 36 %
MCH: 30.6 pg (ref 26.6–33.0)
MCHC: 33.6 g/dL (ref 31.5–35.7)
MCV: 91 fL (ref 79–97)
Monocytes Absolute: 0.6 10*3/uL (ref 0.1–0.9)
Monocytes: 9 %
Neutrophils Absolute: 3.4 10*3/uL (ref 1.4–7.0)
Neutrophils: 53 %
Platelets: 183 10*3/uL (ref 150–450)
RBC: 4.97 x10E6/uL (ref 4.14–5.80)
RDW: 13 % (ref 11.6–15.4)
WBC: 6.4 10*3/uL (ref 3.4–10.8)

## 2021-04-24 LAB — COMPREHENSIVE METABOLIC PANEL
ALT: 27 IU/L (ref 0–44)
AST: 18 IU/L (ref 0–40)
Albumin/Globulin Ratio: 1.8 (ref 1.2–2.2)
Albumin: 4.8 g/dL (ref 3.8–4.9)
Alkaline Phosphatase: 72 IU/L (ref 44–121)
BUN/Creatinine Ratio: 16 (ref 9–20)
BUN: 13 mg/dL (ref 6–24)
Bilirubin Total: 0.4 mg/dL (ref 0.0–1.2)
CO2: 26 mmol/L (ref 20–29)
Calcium: 9.3 mg/dL (ref 8.7–10.2)
Chloride: 103 mmol/L (ref 96–106)
Creatinine, Ser: 0.79 mg/dL (ref 0.76–1.27)
Globulin, Total: 2.6 g/dL (ref 1.5–4.5)
Glucose: 97 mg/dL (ref 65–99)
Potassium: 4.3 mmol/L (ref 3.5–5.2)
Sodium: 143 mmol/L (ref 134–144)
Total Protein: 7.4 g/dL (ref 6.0–8.5)
eGFR: 102 mL/min/{1.73_m2} (ref >59)

## 2021-04-24 LAB — LIPID PANEL
Chol/HDL Ratio: 4.3 ratio (ref 0.0–5.0)
Cholesterol, Total: 166 mg/dL (ref 100–199)
HDL: 39 mg/dL — ABNORMAL LOW (ref >39)
LDL Chol Calc (NIH): 103 mg/dL — ABNORMAL HIGH (ref 0–99)
Triglycerides: 136 mg/dL (ref 0–149)
VLDL Cholesterol Cal: 24 mg/dL (ref 5–40)

## 2021-04-24 LAB — PSA: Prostate Specific Ag, Serum: 0.8 ng/mL (ref 0.0–4.0)

## 2021-04-29 ENCOUNTER — Ambulatory Visit: Payer: Self-pay | Admitting: Gerontology

## 2021-04-29 ENCOUNTER — Other Ambulatory Visit: Payer: Self-pay

## 2021-04-29 DIAGNOSIS — I10 Essential (primary) hypertension: Secondary | ICD-10-CM

## 2021-04-29 NOTE — Progress Notes (Unsigned)
Patient in today to have an EKG only per Lanora Manis, NP. EKG obtained, patient tolerated without incident. His EKG was normal sinus rhythm, lab was reviewed.

## 2021-05-19 ENCOUNTER — Ambulatory Visit: Payer: Self-pay | Admitting: Gerontology

## 2021-05-27 ENCOUNTER — Ambulatory Visit: Payer: Self-pay | Admitting: Adult Health

## 2021-06-02 ENCOUNTER — Ambulatory Visit: Payer: Self-pay | Admitting: Gerontology

## 2021-06-02 ENCOUNTER — Other Ambulatory Visit: Payer: Self-pay

## 2021-06-02 VITALS — BP 138/81 | HR 50 | Temp 97.0°F | Resp 16 | Wt 284.1 lb

## 2021-06-02 DIAGNOSIS — R001 Bradycardia, unspecified: Secondary | ICD-10-CM

## 2021-06-02 DIAGNOSIS — I8392 Asymptomatic varicose veins of left lower extremity: Secondary | ICD-10-CM

## 2021-06-02 DIAGNOSIS — I10 Essential (primary) hypertension: Secondary | ICD-10-CM

## 2021-06-02 DIAGNOSIS — I8390 Asymptomatic varicose veins of unspecified lower extremity: Secondary | ICD-10-CM | POA: Insufficient documentation

## 2021-06-02 DIAGNOSIS — E782 Mixed hyperlipidemia: Secondary | ICD-10-CM

## 2021-06-02 MED ORDER — ATORVASTATIN CALCIUM 40 MG PO TABS: 40.0000 mg | ORAL_TABLET | Freq: Every day | ORAL | 0 refills | Status: DC

## 2021-06-02 NOTE — Patient Instructions (Signed)
Cocinar con menos sal Cooking With Less Salt Cocinar con menos sal es una manera de reducir la cantidad de sodio que los alimentos le aportan. El sodio es uno de los elementos que componen la sal. Se encuentra naturalmente en los alimentos y tambin se agrega a ciertos alimentos. En funcin de su afeccin y salud general, el mdico o el nutricionista pueden recomendarle que reduzca el consumo de sodio. La mayora de las personas debe consumir menos de 2,300 miligramos (mg) de sodio por da. Si tiene hipertensin, es posible que deba limitar el consumo de sodio a 1,500 mg por da. Siga losconsejos a continuacin que lo ayudarn a reducir el consumo de sodio. Cules son los consejos para consumir menos sodio? Al leer las etiquetas de los alimentos  Revise la etiqueta del alimento antes de comprar o usar ingredientes envasados. Revise siempre la etiqueta para conocer el tamao de la porcin y el contenido de sodio. Busque productos que no contengan ms de 140 mg de sodio por porcin. Verifique la columna de valor porcentual diario para ver qu porcentaje de la cantidad diaria recomendada de sodio contiene una porcin del producto. Los alimentos que tienen 5 % o menos en esta columna se consideran con bajo contenido de sodio. Los alimentos con 20 % o ms se consideran con alto contenido de sodio. No elija los alimentos con sal como uno de los tres primeros ingredientes en la lista de ingredientes. Si la sal es uno de los tres primeros ingredientes, generalmente significa que el alimento tiene alto contenido de sodio.  Al ir de compras Compre productos sin sodio o con bajo contenido de sodio. Busque las siguientes palabras en las etiquetas de los alimentos: Bajo contenido de sodio. Sin sodio. Reducido en sodio. Sin agregado de sal. Sin sal. Siempre controle el contenido de sodio, incluso si en la etiqueta de los alimentos dice "con bajo contenido de sodio" o "sin sal agregada". Compre alimentos  frescos. Al cocinar Use hierbas, condimentos sin sal y especias como sustitutos de la sal. Use bicarbonato sdico que no contenga sodio. Cocine los alimentos grillados, en estofado o a la parrilla para agregarles sabor utilizando menos sal. Evite agregar sal a las pastas, el arroz y los cereales calientes. Escurra y enjuague las verduras, los frijoles y las carnes en latas antes de consumir estos alimentos. Evite agregar sal cuando cocina dulces y postres. Cocine con ingredientes con bajo contenido de sodio. Qu alimentos tienen alto contenido de sodio? Verduras Verduras enlatadas regulares (que no sean con bajo contenido de sodio o reducidas en sodio). Chucrut, verduras en escabeche y salsas. Aceitunas. Papas fritas. Anillos de cebollas. Pasta y salsa de tomate en lata comunes. Jugoscomunes de tomate y de verduras. Verduras congeladas en salsa. Granos Cereales instantneos para comer caliente. Mezclas para bizcochos, panqueques y rellenos de pan. Crutones. Mezclas para pastas o arroz con condimento. Envases comerciales de sopa de fideos. Macarrones con queso envasados o congelados. Galletas saladas comunes. Harina leudante. Bollos. Bagels. Tortillas y wraps deharina. Carnes y otras protenas Carne de vaca o pescado que est salada, enlatada, ahumada, curada, condimentada o en escabeche. Esto incluye el tocino, el jamn, las salchichas, los hot dogs, la carne en conserva, la carne ahumada, los panes de carne, la carne de cerdo salada, la cecina, los arenques en escabeche, las anchoas, elatn enlatado comn y las sardinas. Frutos secos con sal. Lcteos Quesos para untar y quesos procesados. Requesn. Queso azul. Queso feta. Quesoen hebras. Queso cottage comn. Suero de leche. Leche   enlatada. Es posible que los productos enumerados anteriormente no sean una lista completa de los alimentos con alto contenido de sodio. Las cantidades reales de sodio ser diferentes dependiendo del procesamiento.  Consulte a un nutricionista para obtener ms informacin. Cules son los alimentos con bajo contenido de sodio? Frutas Frutas frescas, congeladas o enlatadas sin agregado de salsa. Jugo de frutas. Verduras Verduras frescas o congeladas sin agregado de salsa. Verduras enlatadas "sin agregado de sal". Salsa de tomate y pastas "sin agregado de sal". Jugos detomate y verduras con bajo contenido de sodio o reducidos en sodio. Granos Fideos, pastas, quinua, arroz. Trigo triturado o inflado, o arroz inflado. Avena comn o rpida (no instantnea). Galletas con bajo contenido de sodio. Pan con bajo contenido de sodio. Panes y pastas integrales. Palomitas de mazsin sal. Carnes y otras protenas Carnes, aves (sin sodio inyectado) y pescado frescos o congelados sin agregado de salsa. Frutos secos sin sal. Lentejas, frijoles y guisantes secos, sin agregado de sal. Frijoles enlatados sin sal. Huevos. Mantequillas de frutossecos sin sal. Atn o pollo enlatado con bajo contenido de sodio. Lcteos Leche. Leche de soja. Yogur. Quesos con bajo contenido de sodio, como el queso suizo, [place], mozzarella y ricotta. Sorbetes o helados ( taza porporcin). Queso crema. Grasas y aceites Mantequilla o margarina sin sal. Otros alimentos Pudines caseros. Bicarbonato sdico y polvo de hornear que no contengan sodio.Hierbas y especias. Mezclas para condimento con bajo contenido de sodio. Bebidas T y caf. Bebidas con gas. Es posible que los productos enumerados anteriormente no sean una lista completa de los alimentos con bajo contenido de sodio. Las cantidades reales de sodio ser diferentes dependiendo del procesamiento. Consulte a un nutricionista para obtener ms informacin. Cules son algunas alternativas para la sal a la hora de cocinar? A continuacin, se enumeran las hierbas, los condimentos y las especias que pueden usarse en lugar de la sal para dar sabor a las comidas. Las hierbas deben estar frescas o  secas. No elija las mezclas envasadas. Al lado del nombre de cada hierba, especia o condimento, se incluyen algunos ejemplos de alimentos con los que puedencombinarse. Hierbas Hojas de laurel: sopas, platos de carne y verduras, y salsa de espaguetis. [place]: platos de la cocina italiana, sopas, pastas y platos de pescado. Cilantro: carnes, aves y platos de verduras. [place] en polvo: marinadas y platos de la cocina mexicana. [place]eboll[place]: alios para ensaladas y platos de patatas. Comino: platos de la cocina mexicana, cuscs y platos de carne. Eneldo: platos de pescado, salsas y ensaladas. Hinojo: platos de carne y verduras, panes y galletas. Ajo (no use sal de ajo): platos de la cocina italiana, platos de carne, alios para ensaladas y salsas. Mejorana: sopas, platos de patatas y platos de carne. Organo: pizzas y salsa de espaguetis. [place]: ensaladas, sopas, pastas y platos de carne. Romero: platos de la cocina italiana, alios para ensaladas, sopas y carnes rojas. Azafrn: platos de pescado, pastas y algunos platos de ave. Salvia: rellenos y salsas. Estragn: platos de pescado y de ave. Tomillo: rellenos, carnes y platos de pescado. Alios Jugo de limn: platos de pescado, platos de ave, verduras y ensaladas. Vinagre: alios para ensaladas, verduras y platos de pescado. Especias [place]ela: platos dulces, como tortas, galletas y pudines. Clavo de olor: pan de jengibre, pudines y marinadas para carnes. [place]: platos de verduras, platos de pescado y de ave, y salteados. Jengibre: platos de verduras, platos de pescado y salteados. Nuez moscada: pastas, verduras, aves, platos de pescado y natillas. Resumen Cocinar con   menos sal es una manera de reducir la cantidad de sodio que aportan los alimentos. Compre productos sin sodio o con bajo contenido de sodio. Revise la etiqueta del alimento antes de usar o comprar ingredientes envasados. Use hierbas, condimentos sin sal y especias como  sustitutos de la sal en las comidas. Esta informacin no tiene como fin reemplazar el consejo del mdico. Asegresede hacerle al mdico cualquier pregunta que tenga. Document Revised: 10/29/2019 Document Reviewed: 10/29/2019 Elsevier Patient Education  2022 Elsevier Inc.  

## 2021-06-02 NOTE — Progress Notes (Signed)
Established Patient Office Visit  Subjective:  Patient ID: Scott Bishop, male    DOB: 03-25-1961  Age: 60 y.o. MRN: [id number]  CC: No chief complaint on file.   HPI Scott Bishop   is a 60 y/o male who has history of Hypertension, hyperlipidemia, presents for  routine follow up and medication refill. He states that he's compliant with his medication and continues to make healthy lifestyle changes. He checks his blood pressure at home, states that his readings are less than 140/90, but during office visit it was elevated. His heart rate was in the low 50's. He denies chest pain, light headedness , headache, vision changes and shortness pf breath. He had fractured his femur in 1995, which he states resulted to having intermittent bulging of veins to the medial aspect of his left leg. He states that the vein bulges after sitting for more than 4-5 hours,though it's not bothersome to him currently. Overall, he states that he's doing well and offers no further complaint.  Past Medical History:  Diagnosis Date   Hypertension     Past Surgical History:  Procedure Laterality Date   FRACTURE SURGERY  1985   Left Femur & Arm    Family History  Problem Relation Age of Onset   Hypertension Mother    Diabetes Mother    Hyperlipidemia Mother    Diabetes Father    Hyperlipidemia Father    Hypertension Father     Social History   Socioeconomic History   Marital status: Married    Spouse name: Mickel Baas    Number of children: 3   Years of education: Not on file   Highest education level: Bachelor's degree (e.g., BA, AB, BS)  Occupational History   Not on file  Tobacco Use   Smoking status: Former    Types: Cigarettes    Quit date: 10/04/1997    Years since quitting: 23.6   Smokeless tobacco: Never  Substance and Sexual Activity   Alcohol use: No    Comment: Quit in 1999.    Drug use: No   Sexual activity: Not on file  Other Topics Concern   Not on file  Social History Narrative    PT is working at [employer].    Social Determinants of Health   Financial Resource Strain: Not on file  Food Insecurity: Not on file  Transportation Needs: Not on file  Physical Activity: Not on file  Stress: Not on file  Social Connections: Not on file  Intimate Partner Violence: Not on file    Outpatient Medications Prior to Visit  Medication Sig Dispense Refill   fluticasone (FLONASE) 50 MCG/ACT nasal spray Place 2 sprays into both nostrils daily. 16 g 0   lisinopril (ZESTRIL) 20 MG tablet Take 1 tablet (20 mg total) by mouth daily. 90 tablet 0   tamsulosin (FLOMAX) 0.4 MG CAPS capsule Take 1 capsule (0.4 mg total) by mouth daily. 90 capsule 1   atorvastatin (LIPITOR) 10 MG tablet Take 1 tablet (10 mg total) by mouth daily. 90 tablet 1   No facility-administered medications prior to visit.    No Known Allergies  ROS Review of Systems  Constitutional: Negative.   Respiratory: Negative.    Cardiovascular: Negative.   Neurological: Negative.   Psychiatric/Behavioral: Negative.       Objective:    Physical Exam HENT:     Head: Normocephalic and atraumatic.  Cardiovascular:     Rate and Rhythm: Bradycardia present.  Pulmonary:  Effort: Pulmonary effort is normal.     Breath sounds: Normal breath sounds.  Skin:    General: Skin is warm.  Neurological:     General: No focal deficit present.     Mental Status: He is alert and oriented to person, place, and time. Mental status is at baseline.  Psychiatric:        Mood and Affect: Mood normal.        Behavior: Behavior normal.        Thought Content: Thought content normal.        Judgment: Judgment normal.    BP 138/81 (BP Location: Left Arm, Patient Position: Sitting, Cuff Size: Large)   Pulse (!) 50   Temp (!) 97 F (36.1 C)   Resp 16   Wt 284 lb 1.6 oz (128.9 kg)   SpO2 97%   BMI 39.62 kg/m  Wt Readings from Last 3 Encounters:  06/02/21 284 lb 1.6 oz (128.9 kg)  04/23/21 278 lb 6.4 oz (126.3 kg)   01/17/20 289 lb 14.4 oz (131.5 kg)   Encouraged weight loss  Health Maintenance Due  Topic Date Due   PNEUMOCOCCAL POLYSACCHARIDE VACCINE AGE 69-64 HIGH RISK  Never done   FOOT EXAM  Never done   OPHTHALMOLOGY EXAM  Never done   HIV Screening  Never done   Hepatitis C Screening  Never done   TETANUS/TDAP  Never done   COLONOSCOPY (Pts 45-75yr Insurance coverage will need to be confirmed)  Never done   Zoster Vaccines- Shingrix (1 of 2) Never done   COVID-19 Vaccine (3 - Booster for Pfizer series) 06/10/2020   INFLUENZA VACCINE  05/04/2021    There are no preventive care reminders to display for this patient.  Lab Results  Component Value Date   TSH 1.600 02/20/2019   Lab Results  Component Value Date   WBC 6.4 04/23/2021   HGB 15.2 04/23/2021   HCT 45.2 04/23/2021   MCV 91 04/23/2021   PLT 183 04/23/2021   Lab Results  Component Value Date   NA 143 04/23/2021   K 4.3 04/23/2021   CO2 26 04/23/2021   GLUCOSE 97 04/23/2021   BUN 13 04/23/2021   CREATININE 0.79 04/23/2021   BILITOT 0.4 04/23/2021   ALKPHOS 72 04/23/2021   AST 18 04/23/2021   ALT 27 04/23/2021   PROT 7.4 04/23/2021   ALBUMIN 4.8 04/23/2021   CALCIUM 9.3 04/23/2021   EGFR 102 04/23/2021   Lab Results  Component Value Date   CHOL 166 04/23/2021   Lab Results  Component Value Date   HDL 39 (L) 04/23/2021   Lab Results  Component Value Date   LDLCALC 103 (H) 04/23/2021   Lab Results  Component Value Date   TRIG 136 04/23/2021   Lab Results  Component Value Date   CHOLHDL 4.3 04/23/2021   Lab Results  Component Value Date   HGBA1C 5.8 (A) 04/23/2021      Assessment & Plan:     1. Varicose veins of left lower extremity, unspecified whether complicated - Vein to medial aspect of his left leg was approximately 0.5 mm, no swelling erythema nor tenderness to site. He was advised to notify clinic or go to the ED for worsening symptoms.  2. Primary hypertension - His blood pressure  was elevated, his goal should be less than 140/90. He will continue on his medication, DASH diet and exercise as tolerated.  3. Mixed hyperlipidemia The 10-year ASCVD risk score (Mikey BussingDC  Brooke Bonito., et al., 2013) is: 19.4%   Values used to calculate the score:     Age: 55 years     Sex: Male     Is Non-Hispanic African American: No     Diabetic: Yes     Tobacco smoker: No     Systolic Blood Pressure: 536 mmHg     Is BP treated: Yes     HDL Cholesterol: 39 mg/dL     Total Cholesterol: 166 mg/dL His ASCVD risk score was 19.4%, his Lipitor was increased to 40 mg. He was advised to continue on low fat/cholesterol diet and exercise as tolerated. - atorvastatin (LIPITOR) 40 MG tablet; Take 1 tablet (40 mg total) by mouth daily.  Dispense: 30 tablet; Refill: 0  4. Bradycardia - His heart rate was in the low 50's, he was advised to notify clinic or go to the ED for worsening symptoms.       Follow-up: Return in about 29 days (around 07/01/2021), or if symptoms worsen or fail to improve.    Han Lysne Jerold Coombe, NP

## 2021-06-11 ENCOUNTER — Encounter: Payer: Self-pay | Admitting: Gerontology

## 2021-06-11 ENCOUNTER — Ambulatory Visit: Payer: Self-pay | Admitting: Gerontology

## 2021-06-11 ENCOUNTER — Other Ambulatory Visit: Payer: Self-pay

## 2021-06-11 VITALS — BP 134/83 | HR 60 | Temp 97.2°F | Resp 16 | Ht 70.0 in | Wt 273.4 lb

## 2021-06-11 DIAGNOSIS — I6521 Occlusion and stenosis of right carotid artery: Secondary | ICD-10-CM

## 2021-06-11 DIAGNOSIS — E782 Mixed hyperlipidemia: Secondary | ICD-10-CM

## 2021-06-11 DIAGNOSIS — I6529 Occlusion and stenosis of unspecified carotid artery: Secondary | ICD-10-CM | POA: Insufficient documentation

## 2021-06-11 MED ORDER — ATORVASTATIN CALCIUM 10 MG PO TABS: 10.0000 mg | ORAL_TABLET | Freq: Every day | ORAL | 0 refills | Status: DC

## 2021-06-11 NOTE — Progress Notes (Signed)
Established Patient Office Visit  Subjective:  Patient ID: Scott Bishop, male    DOB: 12/25/1960  Age: 60 y.o. MRN: [id number]  CC:  Chief Complaint  Patient presents with   Follow-up    Pt states he went to the [facility] emergency room 2 days ago, was referred to PCP to discuss the dosing of his cholesterol medication. States was having pain in his L arm and shoulder.     HPI Scott Bishop  is a 60 y/o male who has history of Hypertension, hyperlipidemia, presents for follow up after ED visit. His Lipitor was increased to 40 mg during his 06/02/21 clinic visit due to ASCVD 10 year risk of 19.4%. He was seen at [facility] ED on 06/09/21 for fatigue and myalgia. His Creatinine Kinase was 182 and there was a 40% blockage to his right Carotid artery and no blockage in your left carotid artery. Dr S.M. Bhimani recommended that he decrease his Lipitor to 10 mg. Currently, he states that left neck discomfort has improved 95% and his  generalized myalgia has resolved. He denies chest pain, palpitation, shortness of breath and fatigue. Overall, he states that he is doing well and offers no further complaint.   Past Medical History:  Diagnosis Date   Hypertension     Past Surgical History:  Procedure Laterality Date   FRACTURE SURGERY  1985   Left Femur & Arm    Family History  Problem Relation Age of Onset   Hypertension Mother    Diabetes Mother    Hyperlipidemia Mother    Diabetes Father    Hyperlipidemia Father    Hypertension Father     Social History   Socioeconomic History   Marital status: Married    Spouse name: Mickel Baas    Number of children: 3   Years of education: Not on file   Highest education level: Bachelor's degree (e.g., BA, AB, BS)  Occupational History   Not on file  Tobacco Use   Smoking status: Former    Types: Cigarettes    Quit date: 10/04/1997    Years since quitting: 23.7   Smokeless tobacco: Never  Vaping Use   Vaping Use: Never used  Substance and Sexual  Activity   Alcohol use: No    Comment: Quit in 1999.    Drug use: No   Sexual activity: Not on file  Other Topics Concern   Not on file  Social History Narrative   PT is working at [employer].    Social Determinants of Health   Financial Resource Strain: Not on file  Food Insecurity: No Food Insecurity   Worried About Charity fundraiser in the Last Year: Never true   Ran Out of Food in the Last Year: Never true  Transportation Needs: No Transportation Needs   Lack of Transportation (Medical): No   Lack of Transportation (Non-Medical): No  Physical Activity: Not on file  Stress: Not on file  Social Connections: Not on file  Intimate Partner Violence: Not on file    Outpatient Medications Prior to Visit  Medication Sig Dispense Refill   lisinopril (ZESTRIL) 20 MG tablet Take 1 tablet (20 mg total) by mouth daily. 90 tablet 0   tamsulosin (FLOMAX) 0.4 MG CAPS capsule Take 1 capsule (0.4 mg total) by mouth daily. 90 capsule 1   atorvastatin (LIPITOR) 40 MG tablet Take 1 tablet (40 mg total) by mouth daily. (Patient not taking: Reported on 06/11/2021) 30 tablet 0   fluticasone (FLONASE) 50  MCG/ACT nasal spray Place 2 sprays into both nostrils daily. (Patient not taking: Reported on 06/11/2021) 16 g 0   No facility-administered medications prior to visit.    No Known Allergies  ROS Review of Systems  Constitutional: Negative.   Respiratory: Negative.    Cardiovascular: Negative.   Musculoskeletal: Negative.   Neurological: Negative.      Objective:    Physical Exam HENT:     Head: Normocephalic and atraumatic.  Cardiovascular:     Rate and Rhythm: Normal rate and regular rhythm.     Pulses: Normal pulses.     Heart sounds: Normal heart sounds.  Pulmonary:     Effort: Pulmonary effort is normal.     Breath sounds: Normal breath sounds.  Neurological:     General: No focal deficit present.     Mental Status: He is alert and oriented to person, place, and time. Mental  status is at baseline.    BP 134/83 (BP Location: Right Arm, Patient Position: Sitting, Cuff Size: Large)   Pulse 60   Temp (!) 97.2 F (36.2 C)   Resp 16   Ht 5' 10" (1.778 m)   Wt 273 lb 6.4 oz (124 kg)   SpO2 98%   BMI 39.23 kg/m  Wt Readings from Last 3 Encounters:  06/11/21 273 lb 6.4 oz (124 kg)  06/02/21 284 lb 1.6 oz (128.9 kg)  04/23/21 278 lb 6.4 oz (126.3 kg)   He lost 11 pounds in 1 week, he was encouraged to continue his weight loss regimen.  Health Maintenance Due  Topic Date Due   PNEUMOCOCCAL POLYSACCHARIDE VACCINE AGE 19-64 HIGH RISK  Never done   FOOT EXAM  Never done   OPHTHALMOLOGY EXAM  Never done   HIV Screening  Never done   Hepatitis C Screening  Never done   TETANUS/TDAP  Never done   COLONOSCOPY (Pts 45-64yr Insurance coverage will need to be confirmed)  Never done   Zoster Vaccines- Shingrix (1 of 2) Never done   COVID-19 Vaccine (3 - Booster for Pfizer series) 06/10/2020   INFLUENZA VACCINE  05/04/2021    There are no preventive care reminders to display for this patient.  Lab Results  Component Value Date   TSH 1.600 02/20/2019   Lab Results  Component Value Date   WBC 6.4 04/23/2021   HGB 15.2 04/23/2021   HCT 45.2 04/23/2021   MCV 91 04/23/2021   PLT 183 04/23/2021   Lab Results  Component Value Date   NA 143 04/23/2021   K 4.3 04/23/2021   CO2 26 04/23/2021   GLUCOSE 97 04/23/2021   BUN 13 04/23/2021   CREATININE 0.79 04/23/2021   BILITOT 0.4 04/23/2021   ALKPHOS 72 04/23/2021   AST 18 04/23/2021   ALT 27 04/23/2021   PROT 7.4 04/23/2021   ALBUMIN 4.8 04/23/2021   CALCIUM 9.3 04/23/2021   EGFR 102 04/23/2021   Lab Results  Component Value Date   CHOL 166 04/23/2021   Lab Results  Component Value Date   HDL 39 (L) 04/23/2021   Lab Results  Component Value Date   LDLCALC 103 (H) 04/23/2021   Lab Results  Component Value Date   TRIG 136 04/23/2021   Lab Results  Component Value Date   CHOLHDL 4.3  04/23/2021   Lab Results  Component Value Date   HGBA1C 5.8 (A) 04/23/2021      Assessment & Plan:     1. Mixed hyperlipidemia - Due to  Myalgia, he will continue on 10 mg Lipitor, was advised on low fat/ cholesterol diet and exercise as tolerated. - atorvastatin (LIPITOR) 10 MG tablet; Take 1 tablet (10 mg total) by mouth daily.  Dispense: 30 tablet; Refill: 0  2. Occlusion of right carotid artery - He was encouraged to complete [facility] financial application for - Ambulatory referral to Vascular Surgery    Follow-up: Return in about 4 weeks (around 07/09/2021), or if symptoms worsen or fail to improve.    Herndon Grill Jerold Coombe, NP

## 2021-06-11 NOTE — Patient Instructions (Signed)
Heart-Healthy Eating Plan Heart-healthy meal planning includes: Eating less unhealthy fats. Eating more healthy fats. Making other changes in your diet. Talk with your doctor or a diet specialist (dietitian) to create an eating plan that is right for you. What is my plan? Your doctor may recommend an eating plan that includes: Total fat: ______% or less of total calories a day. Saturated fat: ______% or less of total calories a day. Cholesterol: less than _________mg a day. What are tips for following this plan? Cooking Avoid frying your food. Try to bake, boil, grill, or broil it instead. You can also reduce fat by: Removing the skin from poultry. Removing all visible fats from meats. Steaming vegetables in water or broth. Meal planning  At meals, divide your plate into four equal parts: Fill one-half of your plate with vegetables and green salads. Fill one-fourth of your plate with whole grains. Fill one-fourth of your plate with lean protein foods. Eat 4-5 servings of vegetables per day. A serving of vegetables is: 1 cup of raw or cooked vegetables. 2 cups of raw leafy greens. Eat 4-5 servings of fruit per day. A serving of fruit is: 1 medium whole fruit.  cup of dried fruit.  cup of fresh, frozen, or canned fruit.  cup of 100% fruit juice. Eat more foods that have soluble fiber. These are apples, broccoli, carrots, beans, peas, and barley. Try to get 20-30 g of fiber per day. Eat 4-5 servings of nuts, legumes, and seeds per week: 1 serving of dried beans or legumes equals  cup after being cooked. 1 serving of nuts is  cup. 1 serving of seeds equals 1 tablespoon. General information Eat more home-cooked food. Eat less restaurant, buffet, and fast food. Limit or avoid alcohol. Limit foods that are high in starch and sugar. Avoid fried foods. Lose weight if you are overweight. Keep track of how much salt (sodium) you eat. This is important if you have high blood  pressure. Ask your doctor to tell you more about this. Try to add vegetarian meals each week. Fats Choose healthy fats. These include olive oil and canola oil, flaxseeds, walnuts, almonds, and seeds. Eat more omega-3 fats. These include salmon, mackerel, sardines, tuna, flaxseed oil, and ground flaxseeds. Try to eat fish at least 2 times each week. Check food labels. Avoid foods with trans fats or high amounts of saturated fat. Limit saturated fats. These are often found in animal products, such as meats, butter, and cream. These are also found in plant foods, such as palm oil, palm kernel oil, and coconut oil. Avoid foods with partially hydrogenated oils in them. These have trans fats. Examples are stick margarine, some tub margarines, cookies, crackers, and other baked goods. What foods can I eat? Fruits All fresh, canned (in natural juice), or frozen fruits. Vegetables Fresh or frozen vegetables (raw, steamed, roasted, or grilled). Green salads. Grains Most grains. Choose whole wheat and whole grains most of the time. Rice and pasta, including brown rice and pastas made with whole wheat. Meats and other proteins Lean, well-trimmed beef, veal, pork, and lamb. Chicken and turkey without skin. All fish and shellfish. Wild duck, rabbit, pheasant, and venison. Egg whites or low-cholesterol egg substitutes. Dried beans, peas, lentils, and tofu. Seeds and most nuts. Dairy Low-fat or nonfat cheeses, including ricotta and mozzarella. Skim or 1% milk that is liquid, powdered, or evaporated. Buttermilk that is made with low-fat milk. Nonfat or low-fat yogurt. Fats and oils Non-hydrogenated (trans-free) margarines. Vegetable oils, including   soybean, sesame, sunflower, olive, peanut, safflower, corn, canola, and cottonseed. Salad dressings or mayonnaise made with a vegetable oil. Beverages Mineral water. Coffee and tea. Diet carbonated beverages. Sweets and desserts Sherbet, gelatin, and fruit ice.  Small amounts of dark chocolate. Limit all sweets and desserts. Seasonings and condiments All seasonings and condiments. The items listed above may not be a complete list of foods and drinks you can eat. Contact a dietitian for more options. What foods should I avoid? Fruits Canned fruit in heavy syrup. Fruit in cream or butter sauce. Fried fruit. Limit coconut. Vegetables Vegetables cooked in cheese, cream, or butter sauce. Fried vegetables. Grains Breads that are made with saturated or trans fats, oils, or whole milk. Croissants. Sweet rolls. Donuts. High-fat crackers, such as cheese crackers. Meats and other proteins Fatty meats, such as hot dogs, ribs, sausage, bacon, rib-eye roast or steak. High-fat deli meats, such as salami and bologna. Caviar. Domestic duck and goose. Organ meats, such as liver. Dairy Cream, sour cream, cream cheese, and creamed cottage cheese. Whole-milk cheeses. Whole or 2% milk that is liquid, evaporated, or condensed. Whole buttermilk. Cream sauce or high-fat cheese sauce. Yogurt that is made from whole milk. Fats and oils Meat fat, or shortening. Cocoa butter, hydrogenated oils, palm oil, coconut oil, palm kernel oil. Solid fats and shortenings, including bacon fat, salt pork, lard, and butter. Nondairy cream substitutes. Salad dressings with cheese or sour cream. Beverages Regular sodas and juice drinks with added sugar. Sweets and desserts Frosting. Pudding. Cookies. Cakes. Pies. Milk chocolate or white chocolate. Buttered syrups. Full-fat ice cream or ice cream drinks. The items listed above may not be a complete list of foods and drinks to avoid. Contact a dietitian for more information. Summary Heart-healthy meal planning includes eating less unhealthy fats, eating more healthy fats, and making other changes in your diet. Eat a balanced diet. This includes fruits and vegetables, low-fat or nonfat dairy, lean protein, nuts and legumes, whole grains, and  heart-healthy oils and fats. This information is not intended to replace advice given to you by your health care provider. Make sure you discuss any questions you have with your health care provider. Document Revised: 01/29/2021 Document Reviewed: 01/29/2021 Elsevier Patient Education  2022 [employer]  

## 2021-07-01 ENCOUNTER — Ambulatory Visit: Payer: Self-pay | Admitting: Gerontology

## 2021-07-03 ENCOUNTER — Other Ambulatory Visit (INDEPENDENT_AMBULATORY_CARE_PROVIDER_SITE_OTHER): Payer: Self-pay | Admitting: Nurse Practitioner

## 2021-07-03 DIAGNOSIS — I6529 Occlusion and stenosis of unspecified carotid artery: Secondary | ICD-10-CM

## 2021-07-06 ENCOUNTER — Other Ambulatory Visit: Payer: Self-pay

## 2021-07-06 ENCOUNTER — Ambulatory Visit (INDEPENDENT_AMBULATORY_CARE_PROVIDER_SITE_OTHER): Payer: 59 | Admitting: Vascular Surgery

## 2021-07-06 ENCOUNTER — Encounter (INDEPENDENT_AMBULATORY_CARE_PROVIDER_SITE_OTHER): Payer: Self-pay

## 2021-07-06 ENCOUNTER — Encounter (INDEPENDENT_AMBULATORY_CARE_PROVIDER_SITE_OTHER): Payer: Self-pay | Admitting: Vascular Surgery

## 2021-07-06 ENCOUNTER — Ambulatory Visit (INDEPENDENT_AMBULATORY_CARE_PROVIDER_SITE_OTHER): Payer: Self-pay

## 2021-07-06 VITALS — BP 143/84 | HR 60 | Resp 16 | Ht 70.0 in | Wt 268.6 lb

## 2021-07-06 DIAGNOSIS — E119 Type 2 diabetes mellitus without complications: Secondary | ICD-10-CM

## 2021-07-06 DIAGNOSIS — I6521 Occlusion and stenosis of right carotid artery: Secondary | ICD-10-CM

## 2021-07-06 DIAGNOSIS — E782 Mixed hyperlipidemia: Secondary | ICD-10-CM

## 2021-07-06 DIAGNOSIS — I10 Essential (primary) hypertension: Secondary | ICD-10-CM | POA: Diagnosis not present

## 2021-07-06 NOTE — Progress Notes (Signed)
MRN : [id number]  Scott Bishop is a 60 y.o. (12-01-1960) male who presents with chief complaint of follow up for my carotid.  History of Present Illness:   The patient is seen for evaluation of carotid stenosis. The carotid stenosis was identified after a Duplex ultrasound at [facility].  The patient denies amaurosis fugax. There is no recent history of TIA symptoms or focal motor deficits. There is no prior documented CVA.  There is no history of migraine headaches. There is no history of seizures.  The patient is taking enteric-coated aspirin 81 mg daily.  The patient has a history of coronary artery disease, no recent episodes of angina or shortness of breath. The patient denies PAD or claudication symptoms. There is a history of hyperlipidemia which is being treated with a statin.   Duplex ultrasound of the carotid arteries shows <40% ICA stenosis bilaterally   No outpatient medications have been marked as taking for the 07/06/21 encounter (Appointment) with Gilda Crease, Latina Craver, MD.    Past Medical History:  Diagnosis Date   Hypertension     Past Surgical History:  Procedure Laterality Date   FRACTURE SURGERY  1985   Left Femur & Arm    Social History Social History   Tobacco Use   Smoking status: Former    Types: Cigarettes    Quit date: 10/04/1997    Years since quitting: 23.7   Smokeless tobacco: Never  Vaping Use   Vaping Use: Never used  Substance Use Topics   Alcohol use: No    Comment: Quit in 1999.    Drug use: No    Family History Family History  Problem Relation Age of Onset   Hypertension Mother    Diabetes Mother    Hyperlipidemia Mother    Diabetes Father    Hyperlipidemia Father    Hypertension Father     No Known Allergies   REVIEW OF SYSTEMS (Negative unless checked)  Constitutional: [] Weight loss  [] Fever  [] Chills Cardiac: [] Chest pain   [] Chest pressure   [] Palpitations   [] Shortness of breath when laying flat   [] Shortness of  breath with exertion. Vascular:  [] Pain in legs with walking   [] Pain in legs at rest  [] History of DVT   [] Phlebitis   [] Swelling in legs   [] Varicose veins   [] Non-healing ulcers Pulmonary:   [] Uses home oxygen   [] Productive cough   [] Hemoptysis   [] Wheeze  [] COPD   [] Asthma Neurologic:  [] Dizziness   [] Seizures   [] History of stroke   [] History of TIA  [] Aphasia   [] Vissual changes   [] Weakness or numbness in arm   [] Weakness or numbness in leg Musculoskeletal:   [] Joint swelling   [] Joint pain   [] Low back pain Hematologic:  [] Easy bruising  [] Easy bleeding   [] Hypercoagulable state   [] Anemic Gastrointestinal:  [] Diarrhea   [] Vomiting  [] Gastroesophageal reflux/heartburn   [] Difficulty swallowing. Genitourinary:  [] Chronic kidney disease   [] Difficult urination  [] Frequent urination   [] Blood in urine Skin:  [] Rashes   [] Ulcers  Psychological:  [] History of anxiety   []  History of major depression.  Physical Examination  There were no vitals filed for this visit. There is no height or weight on file to calculate BMI. Gen: WD/WN, NAD Head: [place]/AT, No temporalis wasting.  Ear/Nose/Throat: Hearing grossly intact, nares w/o erythema or drainage Eyes: PER, EOMI, sclera nonicteric.  Neck: Supple, no masses.  No bruit or JVD.  Pulmonary:  Good air movement, no audible  wheezing, no use of accessory muscles.  Cardiac: RRR, normal S1, S2, no Murmurs. Vascular:   no bruits noted Vessel Right Left  Radial Palpable Palpable  Carotid Palpable Palpable  Gastrointestinal: soft, non-distended. No guarding/no peritoneal signs.  Musculoskeletal: M/S 5/5 throughout.  No visible deformity.  Neurologic: CN 2-12 intact. Pain and light touch intact in extremities.  Symmetrical.  Speech is fluent. Motor exam as listed above. Psychiatric: Judgment intact, Mood & affect appropriate for pt's clinical situation. Dermatologic: No rashes or ulcers noted.  No changes consistent with cellulitis.   CBC Lab  Results  Component Value Date   WBC 6.4 04/23/2021   HGB 15.2 04/23/2021   HCT 45.2 04/23/2021   MCV 91 04/23/2021   PLT 183 04/23/2021    BMET    Component Value Date/Time   NA 143 04/23/2021 1821   K 4.3 04/23/2021 1821   CL 103 04/23/2021 1821   CO2 26 04/23/2021 1821   GLUCOSE 97 04/23/2021 1821   BUN 13 04/23/2021 1821   CREATININE 0.79 04/23/2021 1821   CALCIUM 9.3 04/23/2021 1821   GFRNONAA 106 01/17/2020 1913   GFRAA 123 01/17/2020 1913   CrCl cannot be calculated (Patient's most recent lab result is older than the maximum 21 days allowed.).  COAG No results found for: INR, PROTIME  Radiology No results found.   Assessment/Plan 1. Stenosis of right carotid artery Recommend:  Given the patient's asymptomatic subcritical stenosis no further invasive testing or surgery at this time.  Duplex ultrasound shows <30% stenosis bilaterally.  Continue antiplatelet therapy as prescribed Continue management of CAD, HTN and Hyperlipidemia Healthy heart diet,  encouraged exercise at least 4 times per week.  Follow up in one year  - VAS US CAROTID; Future  2. Primary hypertension Continue antihypertensive medications as already ordered, these medications have been reviewed and there are no changes at this time.   3. Mixed hyperlipidemia Continue statin as ordered and reviewed, no changes at this time   4. Type 2 diabetes mellitus without complication, unspecified whether long term insulin use (HCC) Continue hypoglycemic medications as already ordered, these medications have been reviewed and there are no changes at this time.  Hgb A1C to be monitored as already arranged by primary service      Levora Dredge, MD  07/06/2021 2:53 PM

## 2021-07-09 ENCOUNTER — Ambulatory Visit: Payer: Self-pay | Admitting: Gerontology

## 2021-07-09 ENCOUNTER — Other Ambulatory Visit: Payer: Self-pay

## 2021-07-09 ENCOUNTER — Encounter: Payer: Self-pay | Admitting: Gerontology

## 2021-07-09 VITALS — BP 143/75 | HR 71 | Temp 96.9°F | Resp 18 | Ht 70.0 in | Wt 268.5 lb

## 2021-07-09 DIAGNOSIS — I10 Essential (primary) hypertension: Secondary | ICD-10-CM

## 2021-07-09 DIAGNOSIS — E782 Mixed hyperlipidemia: Secondary | ICD-10-CM

## 2021-07-09 MED ORDER — ATORVASTATIN CALCIUM 10 MG PO TABS
10.0000 mg | ORAL_TABLET | Freq: Every day | ORAL | 2 refills | Status: AC
Start: 2021-07-09 — End: ?

## 2021-07-09 MED ORDER — LISINOPRIL 20 MG PO TABS: 20.0000 mg | ORAL_TABLET | Freq: Every day | ORAL | 1 refills | Status: DC

## 2021-07-09 NOTE — Patient Instructions (Signed)
Heart-Healthy Eating Plan Heart-healthy meal planning includes: Eating less unhealthy fats. Eating more healthy fats. Making other changes in your diet. Talk with your doctor or a diet specialist (dietitian) to create an eating plan that is right for you. What is my plan? Your doctor may recommend an eating plan that includes: Total fat: ______% or less of total calories a day. Saturated fat: ______% or less of total calories a day. Cholesterol: less than _________mg a day. What are tips for following this plan? Cooking Avoid frying your food. Try to bake, boil, grill, or broil it instead. You can also reduce fat by: Removing the skin from poultry. Removing all visible fats from meats. Steaming vegetables in water or broth. Meal planning  At meals, divide your plate into four equal parts: Fill one-half of your plate with vegetables and green salads. Fill one-fourth of your plate with whole grains. Fill one-fourth of your plate with lean protein foods. Eat 4-5 servings of vegetables per day. A serving of vegetables is: 1 cup of raw or cooked vegetables. 2 cups of raw leafy greens. Eat 4-5 servings of fruit per day. A serving of fruit is: 1 medium whole fruit.  cup of dried fruit.  cup of fresh, frozen, or canned fruit.  cup of 100% fruit juice. Eat more foods that have soluble fiber. These are apples, broccoli, carrots, beans, peas, and barley. Try to get 20-30 g of fiber per day. Eat 4-5 servings of nuts, legumes, and seeds per week: 1 serving of dried beans or legumes equals  cup after being cooked. 1 serving of nuts is  cup. 1 serving of seeds equals 1 tablespoon. General information Eat more home-cooked food. Eat less restaurant, buffet, and fast food. Limit or avoid alcohol. Limit foods that are high in starch and sugar. Avoid fried foods. Lose weight if you are overweight. Keep track of how much salt (sodium) you eat. This is important if you have high blood  pressure. Ask your doctor to tell you more about this. Try to add vegetarian meals each week. Fats Choose healthy fats. These include olive oil and canola oil, flaxseeds, walnuts, almonds, and seeds. Eat more omega-3 fats. These include salmon, mackerel, sardines, tuna, flaxseed oil, and ground flaxseeds. Try to eat fish at least 2 times each week. Check food labels. Avoid foods with trans fats or high amounts of saturated fat. Limit saturated fats. These are often found in animal products, such as meats, butter, and cream. These are also found in plant foods, such as palm oil, palm kernel oil, and coconut oil. Avoid foods with partially hydrogenated oils in them. These have trans fats. Examples are stick margarine, some tub margarines, cookies, crackers, and other baked goods. What foods can I eat? Fruits All fresh, canned (in natural juice), or frozen fruits. Vegetables Fresh or frozen vegetables (raw, steamed, roasted, or grilled). Green salads. Grains Most grains. Choose whole wheat and whole grains most of the time. Rice and pasta, including brown rice and pastas made with whole wheat. Meats and other proteins Lean, well-trimmed beef, veal, pork, and lamb. Chicken and turkey without skin. All fish and shellfish. Wild duck, rabbit, pheasant, and venison. Egg whites or low-cholesterol egg substitutes. Dried beans, peas, lentils, and tofu. Seeds and most nuts. Dairy Low-fat or nonfat cheeses, including ricotta and mozzarella. Skim or 1% milk that is liquid, powdered, or evaporated. Buttermilk that is made with low-fat milk. Nonfat or low-fat yogurt. Fats and oils Non-hydrogenated (trans-free) margarines. Vegetable oils, including   soybean, sesame, sunflower, olive, peanut, safflower, corn, canola, and cottonseed. Salad dressings or mayonnaise made with a vegetable oil. Beverages Mineral water. Coffee and tea. Diet carbonated beverages. Sweets and desserts Sherbet, gelatin, and fruit ice.  Small amounts of dark chocolate. Limit all sweets and desserts. Seasonings and condiments All seasonings and condiments. The items listed above may not be a complete list of foods and drinks you can eat. Contact a dietitian for more options. What foods should I avoid? Fruits Canned fruit in heavy syrup. Fruit in cream or butter sauce. Fried fruit. Limit coconut. Vegetables Vegetables cooked in cheese, cream, or butter sauce. Fried vegetables. Grains Breads that are made with saturated or trans fats, oils, or whole milk. Croissants. Sweet rolls. Donuts. High-fat crackers, such as cheese crackers. Meats and other proteins Fatty meats, such as hot dogs, ribs, sausage, bacon, rib-eye roast or steak. High-fat deli meats, such as salami and bologna. Caviar. Domestic duck and goose. Organ meats, such as liver. Dairy Cream, sour cream, cream cheese, and creamed cottage cheese. Whole-milk cheeses. Whole or 2% milk that is liquid, evaporated, or condensed. Whole buttermilk. Cream sauce or high-fat cheese sauce. Yogurt that is made from whole milk. Fats and oils Meat fat, or shortening. Cocoa butter, hydrogenated oils, palm oil, coconut oil, palm kernel oil. Solid fats and shortenings, including bacon fat, salt pork, lard, and butter. Nondairy cream substitutes. Salad dressings with cheese or sour cream. Beverages Regular sodas and juice drinks with added sugar. Sweets and desserts Frosting. Pudding. Cookies. Cakes. Pies. Milk chocolate or white chocolate. Buttered syrups. Full-fat ice cream or ice cream drinks. The items listed above may not be a complete list of foods and drinks to avoid. Contact a dietitian for more information. Summary Heart-healthy meal planning includes eating less unhealthy fats, eating more healthy fats, and making other changes in your diet. Eat a balanced diet. This includes fruits and vegetables, low-fat or nonfat dairy, lean protein, nuts and legumes, whole grains, and  heart-healthy oils and fats. This information is not intended to replace advice given to you by your health care provider. Make sure you discuss any questions you have with your health care provider. Document Revised: 01/29/2021 Document Reviewed: 01/29/2021 Elsevier Patient Education  2022 [employer] DASH Eating Plan DASH stands for Dietary Approaches to Stop Hypertension. The DASH eating plan is a healthy eating plan that has been shown to: Reduce high blood pressure (hypertension). Reduce your risk for type 2 diabetes, heart disease, and stroke. Help with weight loss. What are tips for following this plan? Reading food labels Check food labels for the amount of salt (sodium) per serving. Choose foods with less than 5 percent of the Daily Value of sodium. Generally, foods with less than 300 milligrams (mg) of sodium per serving fit into this eating plan. To find whole grains, look for the word "whole" as the first word in the ingredient list. Shopping Buy products labeled as "low-sodium" or "no salt added." Buy fresh foods. Avoid canned foods and pre-made or frozen meals. Cooking Avoid adding salt when cooking. Use salt-free seasonings or herbs instead of table salt or sea salt. Check with your health care provider or pharmacist before using salt substitutes. Do not fry foods. Cook foods using healthy methods such as baking, boiling, grilling, roasting, and broiling instead. Cook with heart-healthy oils, such as olive, canola, avocado, soybean, or sunflower oil. Meal planning  Eat a balanced diet that includes: 4 or more servings of fruits and 4 or more   servings of vegetables each day. Try to fill one-half of your plate with fruits and vegetables. 6-8 servings of whole grains each day. Less than 6 oz (170 g) of lean meat, poultry, or fish each day. A 3-oz (85-g) serving of meat is about the same size as a deck of cards. One egg equals 1 oz (28 g). 2-3 servings of low-fat dairy each  day. One serving is 1 cup (237 mL). 1 serving of nuts, seeds, or beans 5 times each week. 2-3 servings of heart-healthy fats. Healthy fats called omega-3 fatty acids are found in foods such as walnuts, flaxseeds, fortified milks, and eggs. These fats are also found in cold-water fish, such as sardines, salmon, and mackerel. Limit how much you eat of: Canned or prepackaged foods. Food that is high in trans fat, such as some fried foods. Food that is high in saturated fat, such as fatty meat. Desserts and other sweets, sugary drinks, and other foods with added sugar. Full-fat dairy products. Do not salt foods before eating. Do not eat more than 4 egg yolks a week. Try to eat at least 2 vegetarian meals a week. Eat more home-cooked food and less restaurant, buffet, and fast food. Lifestyle When eating at a restaurant, ask that your food be prepared with less salt or no salt, if possible. If you drink alcohol: Limit how much you use to: 0-1 drink a day for women who are not pregnant. 0-2 drinks a day for men. Be aware of how much alcohol is in your drink. In the [place] one drink equals one 12 oz bottle of beer (355 mL), one 5 oz glass of wine (148 mL), or one 1 oz glass of hard liquor (44 mL). General information Avoid eating more than 2,300 mg of salt a day. If you have hypertension, you may need to reduce your sodium intake to 1,500 mg a day. Work with your health care provider to maintain a healthy body weight or to lose weight. Ask what an ideal weight is for you. Get at least 30 minutes of exercise that causes your heart to beat faster (aerobic exercise) most days of the week. Activities may include walking, swimming, or biking. Work with your health care provider or dietitian to adjust your eating plan to your individual calorie needs. What foods should I eat? Fruits All fresh, dried, or frozen fruit. Canned fruit in natural juice (without added sugar). Vegetables Fresh or frozen  vegetables (raw, steamed, roasted, or grilled). Low-sodium or reduced-sodium tomato and vegetable juice. Low-sodium or reduced-sodium tomato sauce and tomato paste. Low-sodium or reduced-sodium canned vegetables. Grains Whole-grain or whole-wheat bread. Whole-grain or whole-wheat pasta. Brown rice. Oatmeal. Quinoa. Bulgur. Whole-grain and low-sodium cereals. Pita bread. Low-fat, low-sodium crackers. Whole-wheat flour tortillas. Meats and other proteins Skinless chicken or turkey. Ground chicken or turkey. Pork with fat trimmed off. Fish and seafood. Egg whites. Dried beans, peas, or lentils. Unsalted nuts, nut butters, and seeds. Unsalted canned beans. Lean cuts of beef with fat trimmed off. Low-sodium, lean precooked or cured meat, such as sausages or meat loaves. Dairy Low-fat (1%) or fat-free (skim) milk. Reduced-fat, low-fat, or fat-free cheeses. Nonfat, low-sodium ricotta or cottage cheese. Low-fat or nonfat yogurt. Low-fat, low-sodium cheese. Fats and oils Soft margarine without trans fats. Vegetable oil. Reduced-fat, low-fat, or light mayonnaise and salad dressings (reduced-sodium). Canola, safflower, olive, avocado, soybean, and sunflower oils. Avocado. Seasonings and condiments Herbs. Spices. Seasoning mixes without salt. Other foods Unsalted popcorn and pretzels. Fat-free sweets. The items   listed above may not be a complete list of foods and beverages you can eat. Contact a dietitian for more information. What foods should I avoid? Fruits Canned fruit in a light or heavy syrup. Fried fruit. Fruit in cream or butter sauce. Vegetables Creamed or fried vegetables. Vegetables in a cheese sauce. Regular canned vegetables (not low-sodium or reduced-sodium). Regular canned tomato sauce and paste (not low-sodium or reduced-sodium). Regular tomato and vegetable juice (not low-sodium or reduced-sodium). Pickles. Olives. Grains Baked goods made with fat, such as croissants, muffins, or some  breads. Dry pasta or rice meal packs. Meats and other proteins Fatty cuts of meat. Ribs. Fried meat. Bacon. Bologna, salami, and other precooked or cured meats, such as sausages or meat loaves. Fat from the back of a pig (fatback). Bratwurst. Salted nuts and seeds. Canned beans with added salt. Canned or smoked fish. Whole eggs or egg yolks. Chicken or turkey with skin. Dairy Whole or 2% milk, cream, and half-and-half. Whole or full-fat cream cheese. Whole-fat or sweetened yogurt. Full-fat cheese. Nondairy creamers. Whipped toppings. Processed cheese and cheese spreads. Fats and oils Butter. Stick margarine. Lard. Shortening. Ghee. Bacon fat. Tropical oils, such as coconut, palm kernel, or palm oil. Seasonings and condiments Onion salt, garlic salt, seasoned salt, table salt, and sea salt. Worcestershire sauce. Tartar sauce. Barbecue sauce. Teriyaki sauce. Soy sauce, including reduced-sodium. Steak sauce. Canned and packaged gravies. Fish sauce. Oyster sauce. Cocktail sauce. Store-bought horseradish. Ketchup. Mustard. Meat flavorings and tenderizers. Bouillon cubes. Hot sauces. Pre-made or packaged marinades. Pre-made or packaged taco seasonings. Relishes. Regular salad dressings. Other foods Salted popcorn and pretzels. The items listed above may not be a complete list of foods and beverages you should avoid. Contact a dietitian for more information. Where to find more information National Heart, Lung, and Blood Institute: www.nhlbi.nih.gov American Heart Association: www.heart.org Academy of Nutrition and Dietetics: www.eatright.org National Kidney Foundation: www.kidney.org Summary The DASH eating plan is a healthy eating plan that has been shown to reduce high blood pressure (hypertension). It may also reduce your risk for type 2 diabetes, heart disease, and stroke. When on the DASH eating plan, aim to eat more fresh fruits and vegetables, whole grains, lean proteins, low-fat dairy, and  heart-healthy fats. With the DASH eating plan, you should limit salt (sodium) intake to 2,300 mg a day. If you have hypertension, you may need to reduce your sodium intake to 1,500 mg a day. Work with your health care provider or dietitian to adjust your eating plan to your individual calorie needs. This information is not intended to replace advice given to you by your health care provider. Make sure you discuss any questions you have with your health care provider. Document Revised: 08/24/2019 Document Reviewed: 08/24/2019 Elsevier Patient Education  2022 [employer] Inc[employer]  

## 2021-07-09 NOTE — Progress Notes (Signed)
Established Patient Office Visit  Subjective:  Patient ID: Scott Bishop, male    DOB: [birth year]  Age: 60 y.o. MRN: [id number]  CC:  Chief Complaint  Patient presents with   Follow-up   Hyperlipidemia    Decrease medication dosage after ER visit. Attempting to manage with diet.    HPI Scott Bishop  is a 60 y/o male who has history of Hypertension, hyperlipidemia,presents for follow up visit. His Atorvastatin was decreased to 10 mg, denies myalgia and muscle weakness. He was seen at [facility] by Dr Vilinda Boehringer on 07/06/21 for Stenosis of right carotid artery. He recommended that e continues current treatment regimen and follow up with Vascular ultrasound in future. He denies chest pain, palpitation, headache, vision changes and myalgia. Overall, he states that he's doing well and offers no further complaint.   Past Medical History:  Diagnosis Date   Hypertension     Past Surgical History:  Procedure Laterality Date   FRACTURE SURGERY  1985   Left Femur & Arm    Family History  Problem Relation Age of Onset   Hypertension Mother    Diabetes Mother    Hyperlipidemia Mother    Diabetes Father    Hyperlipidemia Father    Hypertension Father     Social History   Socioeconomic History   Marital status: Married    Spouse name: Mickel Baas    Number of children: 3   Years of education: Not on file   Highest education level: Bachelor's degree (e.g., BA, AB, BS)  Occupational History   Not on file  Tobacco Use   Smoking status: Former    Types: Cigarettes    Quit date: 10/04/1997    Years since quitting: 23.7   Smokeless tobacco: Never  Vaping Use   Vaping Use: Never used  Substance and Sexual Activity   Alcohol use: No    Comment: Quit in 1999.    Drug use: No   Sexual activity: Not on file  Other Topics Concern   Not on file  Social History Narrative   PT is working at [employer].    Social Determinants of Health   Financial Resource Strain: Not on  file  Food Insecurity: No Food Insecurity   Worried About Charity fundraiser in the Last Year: Never true   Ran Out of Food in the Last Year: Never true  Transportation Needs: No Transportation Needs   Lack of Transportation (Medical): No   Lack of Transportation (Non-Medical): No  Physical Activity: Not on file  Stress: Not on file  Social Connections: Not on file  Intimate Partner Violence: Not on file    Outpatient Medications Prior to Visit  Medication Sig Dispense Refill   tamsulosin (FLOMAX) 0.4 MG CAPS capsule Take 1 capsule (0.4 mg total) by mouth daily. 90 capsule 1   atorvastatin (LIPITOR) 10 MG tablet Take 1 tablet (10 mg total) by mouth daily. 30 tablet 0   lisinopril (ZESTRIL) 20 MG tablet Take 1 tablet (20 mg total) by mouth daily. 90 tablet 0   No facility-administered medications prior to visit.    No Known Allergies  ROS Review of Systems  Constitutional: Negative.   Eyes: Negative.   Respiratory: Negative.    Cardiovascular: Negative.   Musculoskeletal: Negative.   Neurological: Negative.      Objective:    Physical Exam HENT:     Head: Normocephalic and atraumatic.     Mouth/Throat:     Mouth:  Mucous membranes are moist.  Eyes:     Extraocular Movements: Extraocular movements intact.     Conjunctiva/sclera: Conjunctivae normal.     Pupils: Pupils are equal, round, and reactive to light.  Cardiovascular:     Rate and Rhythm: Normal rate and regular rhythm.     Pulses: Normal pulses.     Heart sounds: Normal heart sounds.  Pulmonary:     Effort: Pulmonary effort is normal.     Breath sounds: Normal breath sounds.  Neurological:     General: No focal deficit present.     Mental Status: He is alert and oriented to person, place, and time. Mental status is at baseline.  Psychiatric:        Mood and Affect: Mood normal.        Behavior: Behavior normal.        Thought Content: Thought content normal.        Judgment: Judgment normal.    BP  (!) 143/75 (BP Location: Left Arm, Patient Position: Sitting, Cuff Size: Large)   Pulse 71   Temp (!) 96.9 F (36.1 C)   Resp 18   Ht 5' 10"  (1.778 m)   Wt 268 lb 8 oz (121.8 kg)   SpO2 97%   BMI 38.53 kg/m  Wt Readings from Last 3 Encounters:  07/09/21 268 lb 8 oz (121.8 kg)  07/06/21 268 lb 9.6 oz (121.8 kg)  06/11/21 273 lb 6.4 oz (124 kg)   Encouraged weight loss   Health Maintenance Due  Topic Date Due   FOOT EXAM  Never done   OPHTHALMOLOGY EXAM  Never done   HIV Screening  Never done   Hepatitis C Screening  Never done   TETANUS/TDAP  Never done   COLONOSCOPY (Pts 45-77yr Insurance coverage will need to be confirmed)  Never done   Zoster Vaccines- Shingrix (1 of 2) Never done   COVID-19 Vaccine (3 - Booster for Pfizer series) 06/10/2020   INFLUENZA VACCINE  05/04/2021    There are no preventive care reminders to display for this patient.  Lab Results  Component Value Date   TSH 1.600 02/20/2019   Lab Results  Component Value Date   WBC 6.4 04/23/2021   HGB 15.2 04/23/2021   HCT 45.2 04/23/2021   MCV 91 04/23/2021   PLT 183 04/23/2021   Lab Results  Component Value Date   NA 143 04/23/2021   K 4.3 04/23/2021   CO2 26 04/23/2021   GLUCOSE 97 04/23/2021   BUN 13 04/23/2021   CREATININE 0.79 04/23/2021   BILITOT 0.4 04/23/2021   ALKPHOS 72 04/23/2021   AST 18 04/23/2021   ALT 27 04/23/2021   PROT 7.4 04/23/2021   ALBUMIN 4.8 04/23/2021   CALCIUM 9.3 04/23/2021   EGFR 102 04/23/2021   Lab Results  Component Value Date   CHOL 166 04/23/2021   Lab Results  Component Value Date   HDL 39 (L) 04/23/2021   Lab Results  Component Value Date   LDLCALC 103 (H) 04/23/2021   Lab Results  Component Value Date   TRIG 136 04/23/2021   Lab Results  Component Value Date   CHOLHDL 4.3 04/23/2021   Lab Results  Component Value Date   HGBA1C 5.8 (A) 04/23/2021      Assessment & Plan:     1. Mixed hyperlipidemia -He's tolerating 10 mg of  Atorvastatin daily, low fat/cholesterol diet and continues to exercise daily. - Lipid panel; Future - atorvastatin (LIPITOR) 10  MG tablet; Take 1 tablet (10 mg total) by mouth daily.  Dispense: 30 tablet; Refill: 2 - Lipid panel  2. Hypertension, unspecified type - His blood pressure is not under control, his goal should be less than 140/90. He will continue on current medication, DASH diet and exercise as tolerated. - lisinopril (ZESTRIL) 20 MG tablet; Take 1 tablet (20 mg total) by mouth daily.  Dispense: 90 tablet; Refill: 1     Follow-up: Return in about 8 weeks (around 09/02/2021), or if symptoms worsen or fail to improve.    Petros Ahart Jerold Coombe, NP

## 2021-07-16 LAB — LIPID PANEL
Chol/HDL Ratio: 3.6 ratio (ref 0.0–5.0)
Cholesterol, Total: 133 mg/dL (ref 100–199)
HDL: 37 mg/dL — ABNORMAL LOW (ref >39)
LDL Chol Calc (NIH): 77 mg/dL (ref 0–99)
Triglycerides: 99 mg/dL (ref 0–149)
VLDL Cholesterol Cal: 19 mg/dL (ref 5–40)

## 2021-09-02 ENCOUNTER — Ambulatory Visit: Payer: Self-pay | Admitting: Gerontology

## 2021-09-02 ENCOUNTER — Encounter: Payer: Self-pay | Admitting: Gerontology

## 2021-09-02 ENCOUNTER — Other Ambulatory Visit: Payer: Self-pay

## 2021-09-02 VITALS — BP 112/66 | HR 58 | Temp 97.5°F | Resp 16 | Ht 68.5 in | Wt 268.5 lb

## 2021-09-02 DIAGNOSIS — Z0000 Encounter for general adult medical examination without abnormal findings: Secondary | ICD-10-CM

## 2021-09-02 DIAGNOSIS — I10 Essential (primary) hypertension: Secondary | ICD-10-CM

## 2021-09-02 DIAGNOSIS — R7303 Prediabetes: Secondary | ICD-10-CM

## 2021-09-02 DIAGNOSIS — H938X3 Other specified disorders of ear, bilateral: Secondary | ICD-10-CM | POA: Insufficient documentation

## 2021-09-02 NOTE — Progress Notes (Signed)
Established Patient Office Visit  Subjective:  Patient ID: Scott Bishop, male    DOB: 09-01-1961  Age: 60 y.o. MRN: [id number]  CC:  Chief Complaint  Patient presents with   Follow-up   Ear Fullness    Patient c/o left ear fullness off & on x 3 months    HPI Scott Bishop is a 60 y/o male with a history of CAD, HTN, HLD, prediabetes who presents for follow up for his hypertension. He reports taking his medication daily and denies chest pain, palpitation, headache, vision changes and myalgia. He reports decreased hearing in his left ear with a sense of fullness off and on since August. He denies pain or discharge from his ears, very rarely uses cotton swabs to clean them. He is due for a diabetic foot exam, retinal exam, and screening colonoscopy. He reports feeling well and has no other complaint.  Past Medical History:  Diagnosis Date   Hypertension     Past Surgical History:  Procedure Laterality Date   FRACTURE SURGERY  1985   Left Femur & Arm    Family History  Problem Relation Age of Onset   Hypertension Mother    Diabetes Mother    Hyperlipidemia Mother    Diabetes Father    Hyperlipidemia Father    Hypertension Father    Diabetes Sister    Hypertension Sister    Diabetes Sister    Hypertension Sister    Diabetes Maternal Grandmother     Social History   Socioeconomic History   Marital status: Married    Spouse name: Mickel Baas    Number of children: 3   Years of education: Not on file   Highest education level: Bachelor's degree (e.g., BA, AB, BS)  Occupational History   Not on file  Tobacco Use   Smoking status: Former    Types: Cigarettes    Quit date: 10/04/1997    Years since quitting: 23.9   Smokeless tobacco: Never  Vaping Use   Vaping Use: Never used  Substance and Sexual Activity   Alcohol use: No    Comment: Quit in 1999.    Drug use: No   Sexual activity: Not on file  Other Topics Concern   Not on file  Social History Narrative   PT is  working at [employer].    Social Determinants of Health   Financial Resource Strain: Not on file  Food Insecurity: No Food Insecurity   Worried About Charity fundraiser in the Last Year: Never true   Ran Out of Food in the Last Year: Never true  Transportation Needs: No Transportation Needs   Lack of Transportation (Medical): No   Lack of Transportation (Non-Medical): No  Physical Activity: Not on file  Stress: Not on file  Social Connections: Not on file  Intimate Partner Violence: Not on file    Outpatient Medications Prior to Visit  Medication Sig Dispense Refill   atorvastatin (LIPITOR) 10 MG tablet Take 1 tablet (10 mg total) by mouth daily. 30 tablet 2   lisinopril (ZESTRIL) 20 MG tablet Take 1 tablet (20 mg total) by mouth daily. 90 tablet 1   tamsulosin (FLOMAX) 0.4 MG CAPS capsule Take 1 capsule (0.4 mg total) by mouth daily. 90 capsule 1   No facility-administered medications prior to visit.    No Known Allergies  ROS Review of Systems  Constitutional:  Negative for activity change, chills, fatigue and fever.  HENT:  Positive for hearing loss. Negative for congestion,  ear discharge, ear pain, facial swelling, postnasal drip, rhinorrhea, sinus pressure, sinus pain, sneezing, sore throat and tinnitus.   Eyes:  Negative for pain, discharge, itching and visual disturbance.  Respiratory:  Negative for cough, chest tightness, shortness of breath and wheezing.   Cardiovascular:  Negative for chest pain, palpitations and leg swelling.  Endocrine: Negative for polydipsia, polyphagia and polyuria.  Musculoskeletal:  Negative for myalgias.  Neurological:  Negative for dizziness, light-headedness and headaches.  Psychiatric/Behavioral:  Negative for dysphoric mood.      Objective:    Physical Exam Vitals and nursing note reviewed.  Constitutional:      Appearance: Normal appearance.  HENT:     Head: Normocephalic.     Right Ear: Hearing, tympanic membrane, ear canal and  external ear normal.     Left Ear: External ear normal. Decreased hearing noted. No laceration, drainage, swelling or tenderness.  No middle ear effusion. There is no impacted cerumen. No foreign body. No mastoid tenderness. No hemotympanum. Tympanic membrane is not injected, scarred, perforated or erythematous.     Ears:     Weber exam findings: Does not lateralize.    Right Rinne: AC > BC.    Left Rinne: BC > AC.    Comments: Painless, bony projections visualized within Left ear canal. Cardiovascular:     Rate and Rhythm: Normal rate and regular rhythm.     Pulses: Normal pulses.     Heart sounds: Normal heart sounds. No murmur heard.   No friction rub. No gallop.  Pulmonary:     Effort: Pulmonary effort is normal.     Breath sounds: Normal breath sounds. No wheezing, rhonchi or rales.  Musculoskeletal:     Right lower leg: No edema.     Left lower leg: No edema.  Skin:    General: Skin is warm and dry.     Capillary Refill: Capillary refill takes less than 2 seconds.  Neurological:     Mental Status: He is alert and oriented to person, place, and time.  Psychiatric:        Mood and Affect: Mood normal.        Behavior: Behavior normal.    BP 112/66 (BP Location: Right Arm, Patient Position: Sitting, Cuff Size: Large)   Pulse (!) 58   Temp (!) 97.5 F (36.4 C) (Oral)   Resp 16   Ht 5' 8.5" (1.74 m)   Wt 268 lb 8 oz (121.8 kg)   SpO2 97%   BMI 40.23 kg/m  Wt Readings from Last 3 Encounters:  09/02/21 268 lb 8 oz (121.8 kg)  07/09/21 268 lb 8 oz (121.8 kg)  07/06/21 268 lb 9.6 oz (121.8 kg)   Encouraged weight loss   Health Maintenance Due  Topic Date Due   Pneumococcal Vaccine 53-61 Years old (1 - PCV) Never done   FOOT EXAM  Never done   OPHTHALMOLOGY EXAM  Never done   HIV Screening  Never done   Hepatitis C Screening  Never done   TETANUS/TDAP  Never done   COLONOSCOPY (Pts 45-45yr Insurance coverage will need to be confirmed)  Never done   Zoster Vaccines-  Shingrix (1 of 2) Never done   COVID-19 Vaccine (3 - Booster for Pfizer series) 03/05/2020    There are no preventive care reminders to display for this patient.  Lab Results  Component Value Date   TSH 1.600 02/20/2019   Lab Results  Component Value Date   WBC 6.4 04/23/2021  HGB 15.2 04/23/2021   HCT 45.2 04/23/2021   MCV 91 04/23/2021   PLT 183 04/23/2021   Lab Results  Component Value Date   NA 143 04/23/2021   K 4.3 04/23/2021   CO2 26 04/23/2021   GLUCOSE 97 04/23/2021   BUN 13 04/23/2021   CREATININE 0.79 04/23/2021   BILITOT 0.4 04/23/2021   ALKPHOS 72 04/23/2021   AST 18 04/23/2021   ALT 27 04/23/2021   PROT 7.4 04/23/2021   ALBUMIN 4.8 04/23/2021   CALCIUM 9.3 04/23/2021   EGFR 102 04/23/2021   Lab Results  Component Value Date   CHOL 133 07/09/2021   Lab Results  Component Value Date   HDL 37 (L) 07/09/2021   Lab Results  Component Value Date   LDLCALC 77 07/09/2021   Lab Results  Component Value Date   TRIG 99 07/09/2021   Lab Results  Component Value Date   CHOLHDL 3.6 07/09/2021   Lab Results  Component Value Date   HGBA1C 5.8 (A) 04/23/2021      Assessment & Plan:  1. Primary hypertension - His blood pressure is within goal of <130/80. He is advised to continue DASH diet, exercise as tolerated, and to continue lisinopril daily.  2. Prediabetes - advised to continue low carb/non-concentrated sweet diet, exercise as tolerated.  - Advised to check feet daily and avoid being barefoot.  - HgB A1c; Future  3. Health care maintenance - Advised to complete [facility] and [facility] financial application for referral for ophthalmology and Gastroenterology for colonoscopy screening. - Lipid panel; Future  4. Ear fullness, bilateral - Multiple non-tender bony protrusions present within left ear canal. No foreign body, effusion, erythema visualized. Weber and Rinne performed.  - Ambulatory referral to ENT   Follow-up: Return in about 10 weeks  (around 11/11/2021), or if symptoms worsen or fail to improve.    Harvin Hazel, RN

## 2021-09-02 NOTE — Patient Instructions (Signed)
Preventing High Cholesterol Cholesterol is a white, waxy substance similar to fat that the human body needs to help build cells. The liver makes all the cholesterol that a person's body needs. Having high cholesterol (hypercholesterolemia) increases your risk for heart disease and stroke. Extra or excess cholesterol comes from the food that you eat. High cholesterol can often be prevented with diet and lifestyle changes. If you already have high cholesterol, you can control it with diet, lifestyle changes, and medicines. How can high cholesterol affect me? If you have high cholesterol, fatty deposits (plaques) may build up on the walls of your blood vessels. The blood vessels that carry blood away from your heart are called arteries. Plaques make the arteries narrower and stiffer. This in turn can: Restrict or block blood flow and cause blood clots to form. Increase your risk for heart attack and stroke. What can increase my risk for high cholesterol? This condition is more likely to develop in people who: Eat foods that are high in saturated fat or cholesterol. Saturated fat is mostly found in foods that come from animal sources. Are overweight. Are not getting enough exercise. Use products that contain nicotine or tobacco, such as cigarettes, e-cigarettes, and chewing tobacco. Have a family history of high cholesterol (familial hypercholesterolemia). What actions can I take to prevent this? Nutrition  Eat less saturated fat. Avoid trans fats (partially hydrogenated oils). These are often found in margarine and in some baked goods, fried foods, and snacks bought in packages. Avoid precooked or cured meat, such as bacon, sausages, or meat loaves. Avoid foods and drinks that have added sugars. Eat more fruits, vegetables, and whole grains. Choose healthy sources of protein, such as fish, poultry, lean cuts of red meat, beans, peas, lentils, and nuts. Choose healthy sources of fat, such  as: Nuts. Vegetable oils, especially olive oil. Fish that have healthy fats, such as omega-3 fatty acids. These fish include mackerel or salmon. Lifestyle Lose weight if you are overweight. Maintaining a healthy body mass index (BMI) can help prevent or control high cholesterol. It can also lower your risk for diabetes and high blood pressure. Ask your health care provider to help you with a diet and exercise plan to lose weight safely. Do not use any products that contain nicotine or tobacco. These products include cigarettes, chewing tobacco, and vaping devices, such as e-cigarettes. If you need help quitting, ask your health care provider. Alcohol use Do not drink alcohol if: Your health care provider tells you not to drink. You are pregnant, may be pregnant, or are planning to become pregnant. If you drink alcohol: Limit how much you have to: 0-1 drink a day for women. 0-2 drinks a day for men. Know how much alcohol is in your drink. In the [place] one drink equals one 12 oz bottle of beer (355 mL), one 5 oz glass of wine (148 mL), or one 1 oz glass of hard liquor (44 mL). Activity  Get enough exercise. Do exercises as told by your health care provider. Each week, do at least 150 minutes of exercise that takes a medium level of effort (moderate-intensity exercise). This kind of exercise: Makes your heart beat faster while allowing you to still be able to talk. Can be done in short sessions several times a day or longer sessions a few times a week. For example, on 5 days each week, you could walk fast or ride your bike 3 times a day for 10 minutes each time. Medicines Your  health care provider may recommend medicines to help lower cholesterol. This may be a medicine to lower the amount of cholesterol that your liver makes. You may need medicine if: °Diet and lifestyle changes have not lowered your cholesterol enough. °You have high cholesterol and other risk factors for heart disease or  stroke. °Take over-the-counter and prescription medicines only as told by your health care provider. °General information °Manage your risk factors for high cholesterol. Talk with your health care provider about all your risk factors and how to lower your risk. °Manage other conditions that you have, such as diabetes or high blood pressure (hypertension). °Have blood tests to check your cholesterol levels at regular points in time as told by your health care provider. °Keep all follow-up visits. This is important. °Where to find more information °American Heart Association: www.heart.org °National Heart, Lung, and Blood Institute: www.nhlbi.nih.gov °Summary °High cholesterol increases your risk for heart disease and stroke. By keeping your cholesterol level low, you can reduce your risk for these conditions. °High cholesterol can often be prevented with diet and lifestyle changes. °Work with your health care provider to manage your risk factors, and have your blood tested regularly. °This information is not intended to replace advice given to you by your health care provider. Make sure you discuss any questions you have with your health care provider. °Document Revised: 11/24/2020 Document Reviewed: 11/24/2020 °Elsevier Patient Education © 2022 [employer] Inc[employer] °DASH Eating Plan °DASH stands for Dietary Approaches to Stop Hypertension. The DASH eating plan is a healthy eating plan that has been shown to: °Reduce high blood pressure (hypertension). °Reduce your risk for type 2 diabetes, heart disease, and stroke. °Help with weight loss. °What are tips for following this plan? °Reading food labels °Check food labels for the amount of salt (sodium) per serving. Choose foods with less than 5 percent of the Daily Value of sodium. Generally, foods with less than 300 milligrams (mg) of sodium per serving fit into this eating plan. °To find whole grains, look for the word "whole" as the first word in the ingredient  list. °Shopping °Buy products labeled as "low-sodium" or "no salt added." °Buy fresh foods. Avoid canned foods and pre-made or frozen meals. °Cooking °Avoid adding salt when cooking. Use salt-free seasonings or herbs instead of table salt or sea salt. Check with your health care provider or pharmacist before using salt substitutes. °Do not fry foods. Cook foods using healthy methods such as baking, boiling, grilling, roasting, and broiling instead. °Cook with heart-healthy oils, such as olive, canola, avocado, soybean, or sunflower oil. °Meal planning ° °Eat a balanced diet that includes: °4 or more servings of fruits and 4 or more servings of vegetables each day. Try to fill one-half of your plate with fruits and vegetables. °6-8 servings of whole grains each day. °Less than 6 oz (170 g) of lean meat, poultry, or fish each day. A 3-oz (85-g) serving of meat is about the same size as a deck of cards. One egg equals 1 oz (28 g). °2-3 servings of low-fat dairy each day. One serving is 1 cup (237 mL). °1 serving of nuts, seeds, or beans 5 times each week. °2-3 servings of heart-healthy fats. Healthy fats called omega-3 fatty acids are found in foods such as walnuts, flaxseeds, fortified milks, and eggs. These fats are also found in cold-water fish, such as sardines, salmon, and mackerel. °Limit how much you eat of: °Canned or prepackaged foods. °Food that is high in trans fat, such   as some fried foods. Food that is high in saturated fat, such as fatty meat. Desserts and other sweets, sugary drinks, and other foods with added sugar. Full-fat dairy products. Do not salt foods before eating. Do not eat more than 4 egg yolks a week. Try to eat at least 2 vegetarian meals a week. Eat more home-cooked food and less restaurant, buffet, and fast food. Lifestyle When eating at a restaurant, ask that your food be prepared with less salt or no salt, if possible. If you drink alcohol: Limit how much you use to: 0-1  drink a day for women who are not pregnant. 0-2 drinks a day for men. Be aware of how much alcohol is in your drink. In the [place] one drink equals one 12 oz bottle of beer (355 mL), one 5 oz glass of wine (148 mL), or one 1 oz glass of hard liquor (44 mL). General information Avoid eating more than 2,300 mg of salt a day. If you have hypertension, you may need to reduce your sodium intake to 1,500 mg a day. Work with your health care provider to maintain a healthy body weight or to lose weight. Ask what an ideal weight is for you. Get at least 30 minutes of exercise that causes your heart to beat faster (aerobic exercise) most days of the week. Activities may include walking, swimming, or biking. Work with your health care provider or dietitian to adjust your eating plan to your individual calorie needs. What foods should I eat? Fruits All fresh, dried, or frozen fruit. Canned fruit in natural juice (without added sugar). Vegetables Fresh or frozen vegetables (raw, steamed, roasted, or grilled). Low-sodium or reduced-sodium tomato and vegetable juice. Low-sodium or reduced-sodium tomato sauce and tomato paste. Low-sodium or reduced-sodium canned vegetables. Grains Whole-grain or whole-wheat bread. Whole-grain or whole-wheat pasta. Brown rice. [place]. Bulgur. Whole-grain and low-sodium cereals. Pita bread. Low-fat, low-sodium crackers. Whole-wheat flour tortillas. Meats and other proteins Skinless chicken or [place]. Ground chicken or [place]. Pork with fat trimmed off. Fish and seafood. Egg whites. Dried beans, peas, or lentils. Unsalted nuts, nut butters, and seeds. Unsalted canned beans. Lean cuts of beef with fat trimmed off. Low-sodium, lean precooked or cured meat, such as sausages or meat loaves. Dairy Low-fat (1%) or fat-free (skim) milk. Reduced-fat, low-fat, or fat-free cheeses. Nonfat, low-sodium ricotta or cottage cheese. Low-fat or nonfat yogurt. Low-fat, low-sodium  cheese. Fats and oils Soft margarine without trans fats. Vegetable oil. Reduced-fat, low-fat, or light mayonnaise and salad dressings (reduced-sodium). Canola, safflower, olive, avocado, soybean, and sunflower oils. Avocado. Seasonings and condiments Herbs. Spices. Seasoning mixes without salt. Other foods Unsalted popcorn and pretzels. Fat-free sweets. The items listed above may not be a complete list of foods and beverages you can eat. Contact a dietitian for more information. What foods should I avoid? Fruits Canned fruit in a light or heavy syrup. Fried fruit. Fruit in cream or butter sauce. Vegetables Creamed or fried vegetables. Vegetables in a cheese sauce. Regular canned vegetables (not low-sodium or reduced-sodium). Regular canned tomato sauce and paste (not low-sodium or reduced-sodium). Regular tomato and vegetable juice (not low-sodium or reduced-sodium). Angie Fava. Olives. Grains Baked goods made with fat, such as croissants, muffins, or some breads. Dry pasta or rice meal packs. Meats and other proteins Fatty cuts of meat. Ribs. Fried meat. Berniece Salines. Bologna, salami, and other precooked or cured meats, such as sausages or meat loaves. Fat from the back of a pig (fatback). Bratwurst. Salted nuts and seeds. Canned  beans with added salt. Canned or smoked fish. Whole eggs or egg yolks. Chicken or turkey with skin. °Dairy °Whole or 2% milk, cream, and half-and-half. Whole or full-fat cream cheese. Whole-fat or sweetened yogurt. Full-fat cheese. Nondairy creamers. Whipped toppings. Processed cheese and cheese spreads. °Fats and oils °Butter. Stick margarine. Lard. Shortening. Ghee. Bacon fat. Tropical oils, such as coconut, palm kernel, or palm oil. °Seasonings and condiments °Onion salt, garlic salt, seasoned salt, table salt, and sea salt. Worcestershire sauce. Tartar sauce. Barbecue sauce. Teriyaki sauce. Soy sauce, including reduced-sodium. Steak sauce. Canned and packaged gravies. Fish sauce.  Oyster sauce. Cocktail sauce. Store-bought horseradish. Ketchup. Mustard. Meat flavorings and tenderizers. Bouillon cubes. Hot sauces. Pre-made or packaged marinades. Pre-made or packaged taco seasonings. Relishes. Regular salad dressings. °Other foods °Salted popcorn and pretzels. °The items listed above may not be a complete list of foods and beverages you should avoid. Contact a dietitian for more information. °Where to find more information °National Heart, Lung, and Blood Institute: www.nhlbi.nih.gov °American Heart Association: www.heart.org °Academy of Nutrition and Dietetics: www.eatright.org °National Kidney Foundation: www.kidney.org °Summary °The DASH eating plan is a healthy eating plan that has been shown to reduce high blood pressure (hypertension). It may also reduce your risk for type 2 diabetes, heart disease, and stroke. °When on the DASH eating plan, aim to eat more fresh fruits and vegetables, whole grains, lean proteins, low-fat dairy, and heart-healthy fats. °With the DASH eating plan, you should limit salt (sodium) intake to 2,300 mg a day. If you have hypertension, you may need to reduce your sodium intake to 1,500 mg a day. °Work with your health care provider or dietitian to adjust your eating plan to your individual calorie needs. °This information is not intended to replace advice given to you by your health care provider. Make sure you discuss any questions you have with your health care provider. °Document Revised: 08/24/2019 Document Reviewed: 08/24/2019 °Elsevier Patient Education © 2022 Elsevier Inc. ° °

## 2021-09-26 ENCOUNTER — Other Ambulatory Visit: Payer: Self-pay | Admitting: Gerontology

## 2021-09-26 DIAGNOSIS — I10 Essential (primary) hypertension: Secondary | ICD-10-CM

## 2021-10-17 ENCOUNTER — Encounter: Payer: Self-pay | Admitting: Intensive Care

## 2021-10-17 ENCOUNTER — Other Ambulatory Visit: Payer: Self-pay

## 2021-10-17 ENCOUNTER — Emergency Department
Admission: EM | Admit: 2021-10-17 | Discharge: 2021-10-17 | Disposition: A | Discharge status: Home / Self Care | Payer: No Typology Code available for payment source | Attending: Emergency Medicine | Admitting: Emergency Medicine

## 2021-10-17 ENCOUNTER — Emergency Department: Payer: No Typology Code available for payment source

## 2021-10-17 DIAGNOSIS — S169XXA Unspecified injury of muscle, fascia and tendon at neck level, initial encounter: Secondary | ICD-10-CM | POA: Diagnosis present

## 2021-10-17 DIAGNOSIS — S161XXA Strain of muscle, fascia and tendon at neck level, initial encounter: Secondary | ICD-10-CM | POA: Insufficient documentation

## 2021-10-17 DIAGNOSIS — Y92410 Unspecified street and highway as the place of occurrence of the external cause: Secondary | ICD-10-CM | POA: Insufficient documentation

## 2021-10-17 DIAGNOSIS — G44319 Acute post-traumatic headache, not intractable: Secondary | ICD-10-CM | POA: Insufficient documentation

## 2021-10-17 DIAGNOSIS — S39012A Strain of muscle, fascia and tendon of lower back, initial encounter: Secondary | ICD-10-CM | POA: Diagnosis not present

## 2021-10-17 HISTORY — DX: Hyperlipidemia, unspecified: E78.5

## 2021-10-17 MED ORDER — ACETAMINOPHEN 325 MG PO TABS
650.0000 mg | ORAL_TABLET | Freq: Once | ORAL | Status: AC
  Administered 2021-10-17: 650 mg via ORAL
  Filled 2021-10-17: qty 2

## 2021-10-17 MED ORDER — HYDROCODONE-ACETAMINOPHEN 5-325 MG PO TABS
1.0000 | ORAL_TABLET | Freq: Once | ORAL | Status: DC
  Filled 2021-10-17: qty 1
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
## 2021-10-17 NOTE — Discharge Instructions (Signed)
Follow-up with your primary care provider if any continued problems.  Take over-the-counter medication as needed for muscle aches, headache or back pain.  You may use ice or heat to your muscles as needed for discomfort.  Be aware that these muscle soreness can last 4 to 5 days which is normal.
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
## 2021-10-17 NOTE — ED Notes (Signed)
Paper discharge consent signed. 

## 2021-10-27 ENCOUNTER — Encounter: Payer: Self-pay | Admitting: Gerontology

## 2021-10-27 ENCOUNTER — Other Ambulatory Visit: Payer: Self-pay

## 2021-10-27 ENCOUNTER — Ambulatory Visit: Payer: Medicaid Other | Admitting: Gerontology

## 2021-10-27 VITALS — BP 128/77 | HR 61 | Temp 97.5°F | Resp 17 | Ht 68.25 in | Wt 271.7 lb

## 2021-10-27 DIAGNOSIS — Z09 Encounter for follow-up examination after completed treatment for conditions other than malignant neoplasm: Secondary | ICD-10-CM

## 2021-10-27 DIAGNOSIS — E782 Mixed hyperlipidemia: Secondary | ICD-10-CM

## 2021-10-27 NOTE — Patient Instructions (Signed)
Plan de alimentacin cardiosaludable Heart-Healthy Eating Plan Muchos factores influyen en la salud del corazn (coronaria), entre ellos, los hbitos de alimentacin y de ejercicio fsico. El riesgo coronario aumenta cuando hay niveles anormales de grasa (lpidos) en la sangre. La planificacin de las comidas cardiosaludables implica limitar las grasas poco saludables, aumentar las grasas saludables y hacer otros cambios en la dieta y el estilo de vida. En qu consiste el plan? El mdico podra recomendarle que haga lo siguiente: Limitar la ingesta de grasas al _________ % o menos del total de caloras por da. Limitar la ingesta de grasas saturadas al _________ % o menos del total de caloras por da. Limitar la cantidad de colesterol en su dieta a menos de _________ mg por da. Cules son algunos consejos para seguir este plan? Al cocinar Evite frer los alimentos a la hora de la coccin. Hornear, hervir, grillar y asar a la parrilla son buenas opciones. Otras formas de reducir el consumo de grasas son las siguientes: Quite la piel de las aves. Quite todas las grasas visibles de las carnes. Cocine al vapor las verduras en agua o caldo. Planificacin de las comidas  En las comidas, imagine dividir su plato en cuartos: Llene la mitad del plato con verduras y ensaladas de hojas verdes. Llene un cuarto del plato con cereales integrales. Llene un cuarto del plato con alimentos con protenas magras. Coma 4 o 5 porciones de verduras por da. Una porcin equivale a una taza de verduras crudas o cocidas o a 2 tazas de verduras de hojas verdes crudas. Coma 4 o 5 porciones de frutas por da. Una porcin equivale a una fruta mediana entera,  taza de fruta desecada;  taza de frutas frescas, congeladas o enlatadas; o  taza de jugo 100 % de fruta. Consuma ms alimentos con fibra soluble. Entre ellos, se incluyen las manzanas, el brcoli, las zanahorias, los frijoles, los guisantes y la cebada. Trate de  consumir de 25 a 30 g de fibra por da. Aumente el consumo de legumbres, frutos secos y semillas a 4 o 5 porciones por semana. Una porcin de frijoles o legumbres secos equivale a  taza despus de su coccin, una porcin de frutos secos equivale a  de taza y una porcin de semillas equivale a 1 cucharada. Grasas Elija grasas saludables con mayor frecuencia. Elija las grasas monoinsaturadas y poliinsaturadas, como el aceite de oliva y el de canola, las semillas de lino, las nueces, las almendras y las semillas. Consuma ms grasas omega-3. Elija salmn, caballa, sardinas, atn, aceite de lino y semillas de lino molidas. Propngase comer pescado al menos dos veces por semana. Lea las etiquetas de los alimentos detenidamente para identificar los que contienen grasas trans o altas cantidades de grasas saturadas. Limite el consumo de grasas saturadas. Estas se encuentran en productos de origen animal, como carnes, mantequilla y crema. Las grasas saturadas de origen vegetal incluyen aceite de palma, de palmiste y de coco. Evite los alimentos con aceites parcialmente hidrogenados. Estos contienen grasas trans. Entre los ejemplos, se incluyen margarinas en barra, algunas margarinas untables, galletas dulces y saladas y otros productos horneados. Evite las comidas fritas. Informacin general Consuma ms comida casera y menos de restaurante, de bares y comida rpida. Limite o evite el alcohol. Limite los alimentos con alto contenido de almidn y azcar. Baje de peso si es necesario. Perder solo del 5 al 10 % de su peso corporal puede ayudarlo a mejorar su estado de salud general y a prevenir enfermedades,   como la diabetes y las enfermedades cardacas. Controle la ingesta de sal (sodio), especialmente si tiene presin arterial alta. Hable con el mdico acerca de cambiar la ingesta de sodio. Intente incorporar ms comidas vegetarianas cada semana. Qu alimentos puedo comer? Frutas Frutas frescas, en conserva  (en su jugo natural) o frutas congeladas. Verduras Verduras frescas o congeladas (crudas, al vapor, asadas o grilladas). Ensaladas de hojas verdes. Cereales La mayora de los cereales. Elija casi siempre trigo integral y cereales integrales. Arroz y pastas, incluido el arroz integral y las pastas elaboradas con trigo integral. Carnes y otras protenas Carnes magras de res, ternera, cerdo y cordero a las que se les haya quitado la grasa visible. Pollo y pavo sin piel. Todos los pescados y mariscos. Pato salvaje, conejo, faisn y venado. Claras de huevo o sustitutos del huevo bajos en colesterol. Porotos, guisantes y lentejas secos y tofu. Semillas y la mayora de los frutos secos. Lcteos Quesos descremados y semidescremados, entre ellos, ricota y mozzarella. Leche descremada o al 1 % (lquida, en polvo o evaporada). Suero de leche elaborado con leche semidescremada. Yogur descremado o semidescremado. Grasas y aceites Margarinas no hidrogenadas (sin grasas trans). Aceites vegetales, incluido el de soja, ssamo, girasol, oliva, man, crtamo, maz, canola y semillas de algodn. Alios para ensalada o mayonesa elaborados con aceite vegetal. Bebidas Agua (mineral o con gas). T y caf. Gaseosas dietticas. Dulces y postres Sorbete, gelatina y helado de frutas. Pequeas cantidades de chocolate amargo. Limite todos los dulces y postres. Alios y condimentos Todos los alios y condimentos. Es posible que los productos que se enumeran ms arriba no constituyan una lista completa de los alimentos y las bebidas que puede tomar. Consulte a un nutricionista para conocer ms opciones. Qu alimentos no se recomiendan? Frutas Fruta enlatada en almbar espeso. Frutas con salsa de crema o mantequilla. Frutas cocidas en aceite. Limite el consumo de coco. Verduras Verduras cocinadas con salsas de queso, crema o mantequilla. Verduras fritas. Cereales Panes elaborados con grasas saturadas o trans, aceites o  leche entera. Croissants. Panecillos dulces. Rosquillas. Galletas con alto contenido de grasas, como las que contienen queso. Carnes y otras protenas Carnes grasas, como perros calientes, costillas de res, salchichas, tocino, asado de costillar o chuletn. Fiambres con alto contenido de grasas, como salame y mortadela. Caviar. Pato y ganso domsticos. Vsceras, como hgado. Lcteos Crema, crema agria, queso crema y queso cottage con crema. Quesos enteros. Leche entera o al 2 % (lquida, evaporada o condensada). Suero de leche entero. Salsa de crema o queso con alto contenido de grasas. Yogur de leche entera. Grasas y aceites Grasa de carne o materia grasa. Manteca de cacao, aceites hidrogenados, aceite de palma, aceite de coco, aceite de palmiste. Grasas y materias grasas slidas, incluida la grasa del tocino, el cerdo salado, la manteca de cerdo y la mantequilla. Sustitutos no lcteos de la crema. Aderezos para ensalada con queso o crema agria. Bebidas Refrescos regulares y cualquier bebida con agregado de azcar. Dulces y postres Glaseados. Pudin. Galletas dulces. Bizcochuelos. Pasteles. Chocolate con leche o chocolate blanco. Almbares con mantequilla. Helados o bebidas elaboradas con helado con alto contenido de grasas. Es posible que los productos que se enumeran ms arriba no sean una lista completa de los alimentos y las bebidas que se deben evitar. Consulte a un nutricionista para obtener ms informacin. Resumen La planificacin de las comidas cardiosaludables implica limitar las grasas poco saludables, aumentar las grasas saludables y hacer otros cambios en la dieta y el estilo de   vida. Baje de peso si es necesario. Perder solo del 5 al 10 % de su peso corporal puede ayudarlo a mejorar su estado de salud general y a prevenir enfermedades, como la diabetes y las enfermedades cardacas. Propngase seguir una dieta equilibrada, que incluya frutas y verduras, productos lcteos descremados o  semidescremados, protenas magras, frutos secos y legumbres, cereales integrales y aceites y grasas cardiosaludables. Esta informacin no tiene como fin reemplazar el consejo del mdico. Asegrese de hacerle al mdico cualquier pregunta que tenga. Document Revised: 01/30/2021 Document Reviewed: 01/30/2021 Elsevier Patient Education  2022 [employer] Inc[employer]  
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
## 2021-10-28 LAB — LIPID PANEL
Chol/HDL Ratio: 3.6 ratio (ref 0.0–5.0)
Cholesterol, Total: 147 mg/dL (ref 100–199)
HDL: 41 mg/dL (ref >39)
LDL Chol Calc (NIH): 83 mg/dL (ref 0–99)
Triglycerides: 126 mg/dL (ref 0–149)
VLDL Cholesterol Cal: 23 mg/dL (ref 5–40)

## 2021-10-28 LAB — CK: Total CK: 190 U/L (ref 41–331)

## 2021-10-28 LAB — HEMOGLOBIN A1C
Est. average glucose Bld gHb Est-mCnc: 120 mg/dL
Hgb A1c MFr Bld: 5.8 % — ABNORMAL HIGH (ref 4.8–5.6)

## 2021-11-11 ENCOUNTER — Other Ambulatory Visit: Payer: Medicaid Other

## 2021-11-12 ENCOUNTER — Other Ambulatory Visit: Payer: Medicaid Other

## 2021-11-19 ENCOUNTER — Ambulatory Visit: Payer: Medicaid Other

## 2022-01-27 ENCOUNTER — Encounter: Payer: Self-pay | Admitting: Gerontology

## 2022-01-27 ENCOUNTER — Ambulatory Visit: Payer: Medicaid Other | Admitting: Gerontology

## 2022-01-27 VITALS — BP 129/84 | HR 57 | Temp 97.7°F | Ht 70.0 in | Wt 271.2 lb

## 2022-01-27 DIAGNOSIS — I10 Essential (primary) hypertension: Secondary | ICD-10-CM

## 2022-01-27 DIAGNOSIS — R001 Bradycardia, unspecified: Secondary | ICD-10-CM

## 2022-01-27 DIAGNOSIS — M25551 Pain in right hip: Secondary | ICD-10-CM | POA: Insufficient documentation

## 2022-01-27 MED ORDER — LISINOPRIL 20 MG PO TABS: 20.0000 mg | ORAL_TABLET | Freq: Every day | ORAL | 1 refills | Status: AC

## 2022-01-27 MED ORDER — CYCLOBENZAPRINE HCL 10 MG PO TABS: 10.0000 mg | ORAL_TABLET | Freq: Three times a day (TID) | ORAL | 0 refills | Status: DC

## 2022-01-27 MED ORDER — PREDNISONE 10 MG PO TABS: 10.0000 mg | ORAL_TABLET | Freq: Every day | ORAL | 0 refills | Status: DC
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
## 2022-01-27 NOTE — Patient Instructions (Addendum)
Heart-Healthy Eating Plan Many factors influence your heart (coronary) health, including eating and exercise habits. Coronary risk increases with abnormal blood fat (lipid) levels. Heart-healthy meal planning includes limiting unhealthy fats, increasing healthy fats, and making other diet and lifestyle changes. What is my plan? Your health care provider may recommend that you: Limit your fat intake to _________% or less of your total calories each day. Limit your saturated fat intake to _________% or less of your total calories each day. Limit the amount of cholesterol in your diet to less than _________ mg per day. What are tips for following this plan? Cooking Cook foods using methods other than frying. Baking, boiling, grilling, and broiling are all good options. Other ways to reduce fat include: Removing the skin from poultry. Removing all visible fats from meats. Steaming vegetables in water or broth. Meal planning  At meals, imagine dividing your plate into fourths: Fill one-half of your plate with vegetables and green salads. Fill one-fourth of your plate with whole grains. Fill one-fourth of your plate with lean protein foods. Eat 4-5 servings of vegetables per day. One serving equals 1 cup raw or cooked vegetable, or 2 cups raw leafy greens. Eat 4-5 servings of fruit per day. One serving equals 1 medium whole fruit,  cup dried fruit,  cup fresh, frozen, or canned fruit, or  cup 100% fruit juice. Eat more foods that contain soluble fiber. Examples include apples, broccoli, carrots, beans, peas, and barley. Aim to get 25-30 g of fiber per day. Increase your consumption of legumes, nuts, and seeds to 4-5 servings per week. One serving of dried beans or legumes equals  cup cooked, 1 serving of nuts is  cup, and 1 serving of seeds equals 1 tablespoon. Fats Choose healthy fats more often. Choose monounsaturated and polyunsaturated fats, such as olive and canola oils, flaxseeds,  walnuts, almonds, and seeds. Eat more omega-3 fats. Choose salmon, mackerel, sardines, tuna, flaxseed oil, and ground flaxseeds. Aim to eat fish at least 2 times each week. Check food labels carefully to identify foods with trans fats or high amounts of saturated fat. Limit saturated fats. These are found in animal products, such as meats, butter, and cream. Plant sources of saturated fats include palm oil, palm kernel oil, and coconut oil. Avoid foods with partially hydrogenated oils in them. These contain trans fats. Examples are stick margarine, some tub margarines, cookies, crackers, and other baked goods. Avoid fried foods. General information Eat more home-cooked food and less restaurant, buffet, and fast food. Limit or avoid alcohol. Limit foods that are high in starch and sugar. Lose weight if you are overweight. Losing just 5-10% of your body weight can help your overall health and prevent diseases such as diabetes and heart disease. Monitor your salt (sodium) intake, especially if you have high blood pressure. Talk with your health care provider about your sodium intake. Try to incorporate more vegetarian meals weekly. What foods can I eat? Fruits All fresh, canned (in natural juice), or frozen fruits. Vegetables Fresh or frozen vegetables (raw, steamed, roasted, or grilled). Green salads. Grains Most grains. Choose whole wheat and whole grains most of the time. Rice and pasta, including brown rice and pastas made with whole wheat. Meats and other proteins Lean, well-trimmed beef, veal, pork, and lamb. Chicken and turkey without skin. All fish and shellfish. Wild duck, rabbit, pheasant, and venison. Egg whites or low-cholesterol egg substitutes. Dried beans, peas, lentils, and tofu. Seeds and most nuts. Dairy Low-fat or nonfat cheeses,   including ricotta and mozzarella. Skim or 1% milk (liquid, powdered, or evaporated). Buttermilk made with low-fat milk. Nonfat or low-fat  yogurt. ?Fats and oils ?Non-hydrogenated (trans-free) margarines. Vegetable oils, including soybean, sesame, sunflower, olive, peanut, safflower, corn, canola, and cottonseed. Salad dressings or mayonnaise made with a vegetable oil. ?Beverages ?Water (mineral or sparkling). Coffee and tea. Diet carbonated beverages. ?Sweets and desserts ?Sherbet, gelatin, and fruit ice. Small amounts of dark chocolate. ?Limit all sweets and desserts. ?Seasonings and condiments ?All seasonings and condiments. ?The items listed above may not be a complete list of foods and beverages you can eat. Contact a dietitian for more options. ?What foods are not recommended? ?Fruits ?Canned fruit in heavy syrup. Fruit in cream or butter sauce. Fried fruit. Limit coconut. ?Vegetables ?Vegetables cooked in cheese, cream, or butter sauce. Fried vegetables. ?Grains ?Breads made with saturated or trans fats, oils, or whole milk. Croissants. Sweet rolls. Donuts. High-fat crackers, such as cheese crackers. ?Meats and other proteins ?Fatty meats, such as hot dogs, ribs, sausage, bacon, rib-eye roast or steak. High-fat deli meats, such as salami and bologna. Caviar. Domestic duck and goose. Organ meats, such as liver. ?Dairy ?Cream, sour cream, cream cheese, and creamed cottage cheese. Whole-milk cheeses. Whole or 2% milk (liquid, evaporated, or condensed). Whole buttermilk. Cream sauce or high-fat cheese sauce. Whole-milk yogurt. ?Fats and oils ?Meat fat, or shortening. Cocoa butter, hydrogenated oils, palm oil, coconut oil, palm kernel oil. Solid fats and shortenings, including bacon fat, salt pork, lard, and butter. Nondairy cream substitutes. Salad dressings with cheese or sour cream. ?Beverages ?Regular sodas and any drinks with added sugar. ?Sweets and desserts ?Frosting. Pudding. Cookies. Cakes. Pies. Milk chocolate or white chocolate. Buttered syrups. Full-fat ice cream or ice cream drinks. ?The items listed above may not be a complete list of  foods and beverages to avoid. Contact a dietitian for more information. ?Summary ?Heart-healthy meal planning includes limiting unhealthy fats, increasing healthy fats, and making other diet and lifestyle changes. ?Lose weight if you are overweight. Losing just 5-10% of your body weight can help your overall health and prevent diseases such as diabetes and heart disease. ?Focus on eating a balance of foods, including fruits and vegetables, low-fat or nonfat dairy, lean protein, nuts and legumes, whole grains, and heart-healthy oils and fats. ?This information is not intended to replace advice given to you by your health care provider. Make sure you discuss any questions you have with your health care provider. ?Document Revised: 01/29/2021 Document Reviewed: 01/29/2021 ?Elsevier Patient Education ? 2022 [employer] ?DASH Eating Plan ?DASH stands for Dietary Approaches to Stop Hypertension. The DASH eating plan is a healthy eating plan that has been shown to: ?Reduce high blood pressure (hypertension). ?Reduce your risk for type 2 diabetes, heart disease, and stroke. ?Help with weight loss. ?What are tips for following this plan? ?Reading food labels ?Check food labels for the amount of salt (sodium) per serving. Choose foods with less than 5 percent of the Daily Value of sodium. Generally, foods with less than 300 milligrams (mg) of sodium per serving fit into this eating plan. ?To find whole grains, look for the word "whole" as the first word in the ingredient list. ?Shopping ?Buy products labeled as "low-sodium" or "no salt added." ?Buy fresh foods. Avoid canned foods and pre-made or frozen meals. ?Cooking ?Avoid adding salt when cooking. Use salt-free seasonings or herbs instead of table salt or sea salt. Check with your health care provider or pharmacist before using salt substitutes. ?  Do not fry foods. Cook foods using healthy methods such as baking, boiling, grilling, roasting, and broiling instead. ?Cook  with heart-healthy oils, such as olive, canola, avocado, soybean, or sunflower oil. ?Meal planning ? ?Eat a balanced diet that includes: ?4 or more servings of fruits and 4 or more servings of vegetables each day. Try to

## 2022-03-03 ENCOUNTER — Encounter: Payer: Self-pay | Admitting: Gerontology

## 2022-03-03 ENCOUNTER — Ambulatory Visit: Payer: Medicaid Other | Admitting: Gerontology

## 2022-03-03 ENCOUNTER — Other Ambulatory Visit: Payer: Self-pay

## 2022-03-03 VITALS — BP 127/69 | HR 60 | Temp 97.8°F | Resp 16 | Ht 68.0 in | Wt 276.2 lb

## 2022-03-03 DIAGNOSIS — M25561 Pain in right knee: Secondary | ICD-10-CM | POA: Insufficient documentation

## 2022-03-03 DIAGNOSIS — I10 Essential (primary) hypertension: Secondary | ICD-10-CM

## 2022-03-03 DIAGNOSIS — Z8739 Personal history of other diseases of the musculoskeletal system and connective tissue: Secondary | ICD-10-CM | POA: Insufficient documentation
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
## 2022-03-03 NOTE — Patient Instructions (Signed)
Back Exercises These exercises help to make your trunk and back strong. They also help to keep the lower back flexible. Doing these exercises can help to prevent or lessen pain in your lower back. If you have back pain, try to do these exercises 2-3 times each day or as told by your doctor. As you get better, do the exercises once each day. Repeat the exercises more often as told by your doctor. To stop back pain from coming back, do the exercises once each day, or as told by your doctor. Do exercises exactly as told by your doctor. Stop right away if you feel sudden pain or your pain gets worse. Exercises Single knee to chest Do these steps 3-5 times in a row for each leg: Lie on your back on a firm bed or the floor with your legs stretched out. Bring one knee to your chest. Grab your knee or thigh with both hands and hold it in place. Pull on your knee until you feel a gentle stretch in your lower back or butt. Keep doing the stretch for 10-30 seconds. Slowly let go of your leg and straighten it. Pelvic tilt Do these steps 5-10 times in a row: Lie on your back on a firm bed or the floor with your legs stretched out. Bend your knees so they point up to the ceiling. Your feet should be flat on the floor. Tighten your lower belly (abdomen) muscles to press your lower back against the floor. This will make your tailbone point up to the ceiling instead of pointing down to your feet or the floor. Stay in this position for 5-10 seconds while you gently tighten your muscles and breathe evenly. Cat-cow Do these steps until your lower back bends more easily: Get on your hands and knees on a firm bed or the floor. Keep your hands under your shoulders, and keep your knees under your hips. You may put padding under your knees. Let your head hang down toward your chest. Tighten (contract) the muscles in your belly. Point your tailbone toward the floor so your lower back becomes rounded like the back of a  cat. Stay in this position for 5 seconds. Slowly lift your head. Let the muscles of your belly relax. Point your tailbone up toward the ceiling so your back forms a sagging arch like the back of a cow. Stay in this position for 5 seconds.  Press-ups Do these steps 5-10 times in a row: Lie on your belly (face-down) on a firm bed or the floor. Place your hands near your head, about shoulder-width apart. While you keep your back relaxed and keep your hips on the floor, slowly straighten your arms to raise the top half of your body and lift your shoulders. Do not use your back muscles. You may change where you place your hands to make yourself more comfortable. Stay in this position for 5 seconds. Keep your back relaxed. Slowly return to lying flat on the floor.  Bridges Do these steps 10 times in a row: Lie on your back on a firm bed or the floor. Bend your knees so they point up to the ceiling. Your feet should be flat on the floor. Your arms should be flat at your sides, next to your body. Tighten your butt muscles and lift your butt off the floor until your waist is almost as high as your knees. If you do not feel the muscles working in your butt and the back of   your thighs, slide your feet 1-2 inches (2.5-5 cm) farther away from your butt. Stay in this position for 3-5 seconds. Slowly lower your butt to the floor, and let your butt muscles relax. If this exercise is too easy, try doing it with your arms crossed over your chest. Belly crunches Do these steps 5-10 times in a row: Lie on your back on a firm bed or the floor with your legs stretched out. Bend your knees so they point up to the ceiling. Your feet should be flat on the floor. Cross your arms over your chest. Tip your chin a little bit toward your chest, but do not bend your neck. Tighten your belly muscles and slowly raise your chest just enough to lift your shoulder blades a tiny bit off the floor. Avoid raising your body  higher than that because it can put too much stress on your lower back. Slowly lower your chest and your head to the floor. Back lifts Do these steps 5-10 times in a row: Lie on your belly (face-down) with your arms at your sides, and rest your forehead on the floor. Tighten the muscles in your legs and your butt. Slowly lift your chest off the floor while you keep your hips on the floor. Keep the back of your head in line with the curve in your back. Look at the floor while you do this. Stay in this position for 3-5 seconds. Slowly lower your chest and your face to the floor. Contact a doctor if: Your back pain gets a lot worse when you do an exercise. Your back pain does not get better within 2 hours after you exercise. If you have any of these problems, stop doing the exercises. Do not do them again unless your doctor says it is okay. Get help right away if: You have sudden, very bad back pain. If this happens, stop doing the exercises. Do not do them again unless your doctor says it is okay. This information is not intended to replace advice given to you by your health care provider. Make sure you discuss any questions you have with your health care provider. Document Revised: 12/03/2020 Document Reviewed: 12/03/2020 Elsevier Patient Education  2023 Elsevier Inc.  

## 2022-07-05 ENCOUNTER — Encounter (INDEPENDENT_AMBULATORY_CARE_PROVIDER_SITE_OTHER): Payer: 59

## 2022-07-05 ENCOUNTER — Ambulatory Visit (INDEPENDENT_AMBULATORY_CARE_PROVIDER_SITE_OTHER): Payer: 59 | Admitting: Vascular Surgery

## 2022-07-29 ENCOUNTER — Encounter (INDEPENDENT_AMBULATORY_CARE_PROVIDER_SITE_OTHER): Payer: 59

## 2022-07-29 ENCOUNTER — Ambulatory Visit (INDEPENDENT_AMBULATORY_CARE_PROVIDER_SITE_OTHER): Payer: 59 | Admitting: Vascular Surgery

## 2022-08-02 ENCOUNTER — Encounter (INDEPENDENT_AMBULATORY_CARE_PROVIDER_SITE_OTHER): Payer: Self-pay
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
## 2023-12-26 ENCOUNTER — Ambulatory Visit: Payer: Self-pay | Admitting: Physician Assistant
